# Patient Record
Sex: Male | Born: 1940 | Race: White | Hispanic: No | Marital: Married | Smoking: Never smoker
Health system: Southern US, Community
[De-identification: ages and names within clinical notes are randomized; demographics above are authoritative.]

## PROBLEM LIST (undated history)

## (undated) DIAGNOSIS — N400 Enlarged prostate without lower urinary tract symptoms: Secondary | ICD-10-CM

## (undated) DIAGNOSIS — E049 Nontoxic goiter, unspecified: Secondary | ICD-10-CM

## (undated) DIAGNOSIS — K219 Gastro-esophageal reflux disease without esophagitis: Secondary | ICD-10-CM

## (undated) DIAGNOSIS — E119 Type 2 diabetes mellitus without complications: Secondary | ICD-10-CM

## (undated) DIAGNOSIS — I6529 Occlusion and stenosis of unspecified carotid artery: Secondary | ICD-10-CM

## (undated) DIAGNOSIS — C801 Malignant (primary) neoplasm, unspecified: Secondary | ICD-10-CM

## (undated) DIAGNOSIS — I10 Essential (primary) hypertension: Secondary | ICD-10-CM

## (undated) DIAGNOSIS — E785 Hyperlipidemia, unspecified: Secondary | ICD-10-CM

## (undated) DIAGNOSIS — I482 Chronic atrial fibrillation, unspecified: Secondary | ICD-10-CM

## (undated) HISTORY — DX: Type 2 diabetes mellitus without complications: E11.9

## (undated) HISTORY — DX: Nontoxic goiter, unspecified: E04.9

## (undated) HISTORY — DX: Benign prostatic hyperplasia without lower urinary tract symptoms: N40.0

## (undated) HISTORY — DX: Gastro-esophageal reflux disease without esophagitis: K21.9

## (undated) HISTORY — PX: OTHER SURGICAL HISTORY: SHX169

## (undated) HISTORY — PX: SEPTOPLASTY: SUR1290

## (undated) HISTORY — DX: Chronic atrial fibrillation, unspecified: I48.20

## (undated) HISTORY — DX: Occlusion and stenosis of unspecified carotid artery: I65.29

## (undated) HISTORY — DX: Hyperlipidemia, unspecified: E78.5

## (undated) HISTORY — DX: Essential (primary) hypertension: I10

## (undated) HISTORY — PX: HERNIA REPAIR: SHX51

## (undated) HISTORY — PX: TONSILECTOMY, ADENOIDECTOMY, BILATERAL MYRINGOTOMY AND TUBES: SHX2538

## (undated) HISTORY — PX: CHOLECYSTECTOMY: SHX55

## (undated) HISTORY — PX: LAMINECTOMY: SHX219

---

## 2000-11-06 ENCOUNTER — Ambulatory Visit (HOSPITAL_BASED_OUTPATIENT_CLINIC_OR_DEPARTMENT_OTHER): Admission: RE | Admit: 2000-11-06 | Discharge: 2000-11-06 | Payer: Self-pay | Admitting: *Deleted

## 2002-08-02 ENCOUNTER — Encounter: Payer: Self-pay | Admitting: Emergency Medicine

## 2002-08-02 ENCOUNTER — Emergency Department (HOSPITAL_COMMUNITY): Admission: EM | Admit: 2002-08-02 | Discharge: 2002-08-02 | Payer: Self-pay | Admitting: Emergency Medicine

## 2004-08-06 ENCOUNTER — Encounter (INDEPENDENT_AMBULATORY_CARE_PROVIDER_SITE_OTHER): Payer: Self-pay | Admitting: *Deleted

## 2004-08-06 ENCOUNTER — Observation Stay (HOSPITAL_COMMUNITY): Admission: EM | Admit: 2004-08-06 | Discharge: 2004-08-07 | Payer: Self-pay | Admitting: Emergency Medicine

## 2005-09-21 IMAGING — RF DG CHOLANGIOGRAM OPERATIVE
1 series · 2 of 2 positions shown · non-contrast
Comparison: none

CLINICAL DATA: 63-year-old, cholecystitis, laparoscopic cholecystectomy. 
 INTRAOPERATIVE CHOLANGIOGRAM 
 Intraoperative cholangiogram was performed with two fluoroscopic spot images.  There is cannulation of the cystic duct.  The common bile duct is slightly distended but no common bile duct stones are seen.  The intrahepatic ducts are not filled out.  There is drainage of contrast into the duodenum.  
 IMPRESSION
 Slightly prominent common bile duct.  No definite common bile duct stones and normal drainage of contrast into the duodenum.   
 Intrahepatic ducts are not visualized.

[Series 1: run · 2 of 2 slices shown]
[im 1/2]
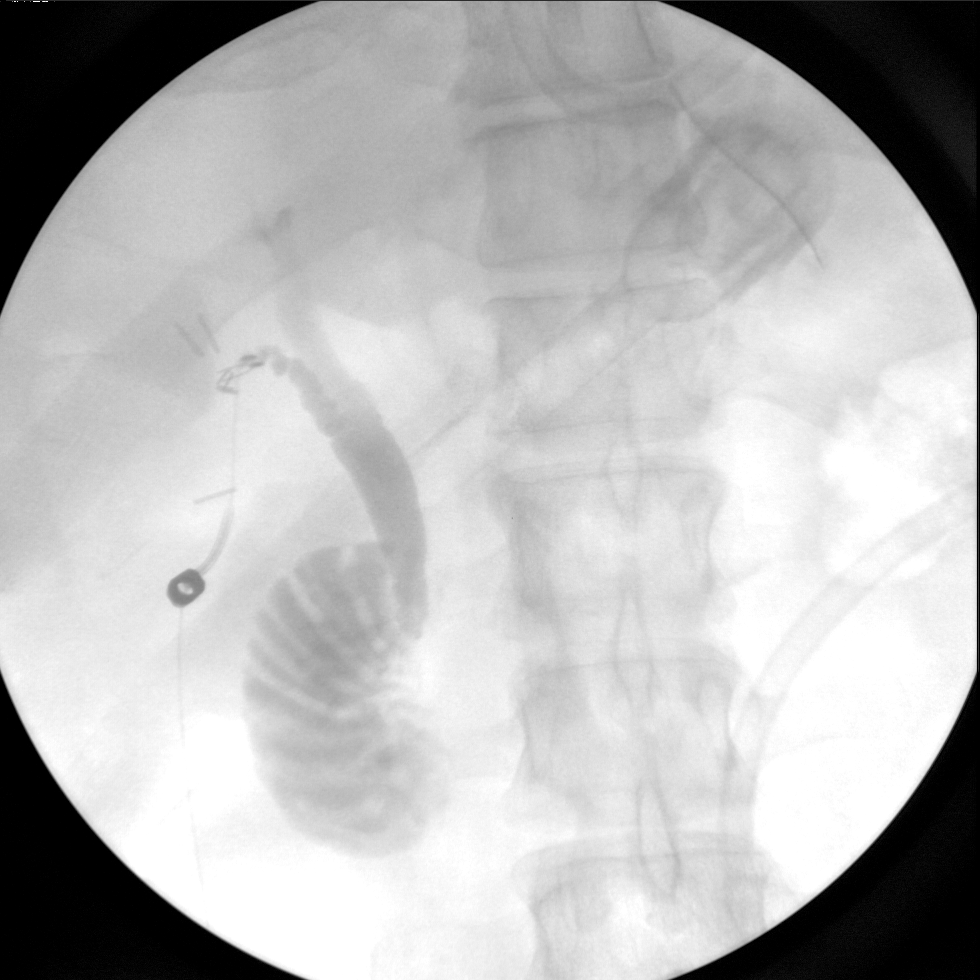
[im 2/2]
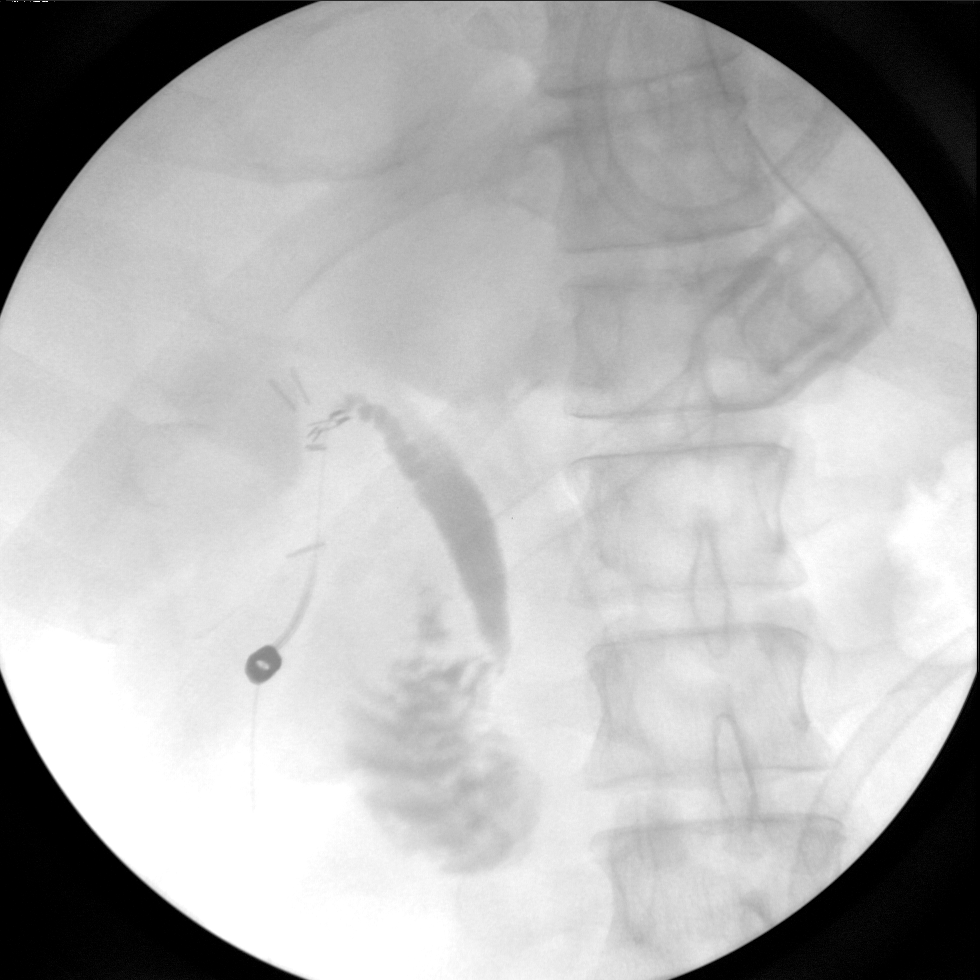

[2 of 2 positions shown; findings below may reference images not displayed]

## 2005-09-21 IMAGING — US US ABDOMEN COMPLETE
1 series · 14 of 25 positions shown · non-contrast
Comparison: none

CLINICAL DATA: Food poisoning, right upper abdominal pain

ABDOMEN ULTRASOUND

[Series 1: unknown · 0.33mm/px · 14 of 65 slices shown]
[im 1/65]
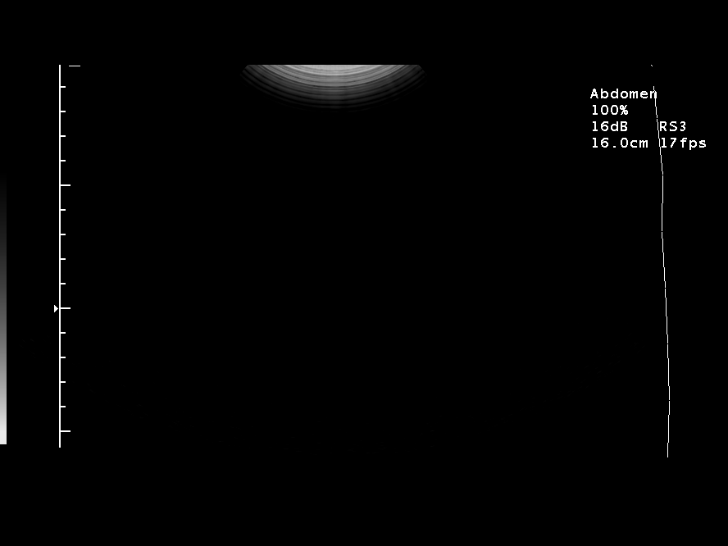
[im 6/65]
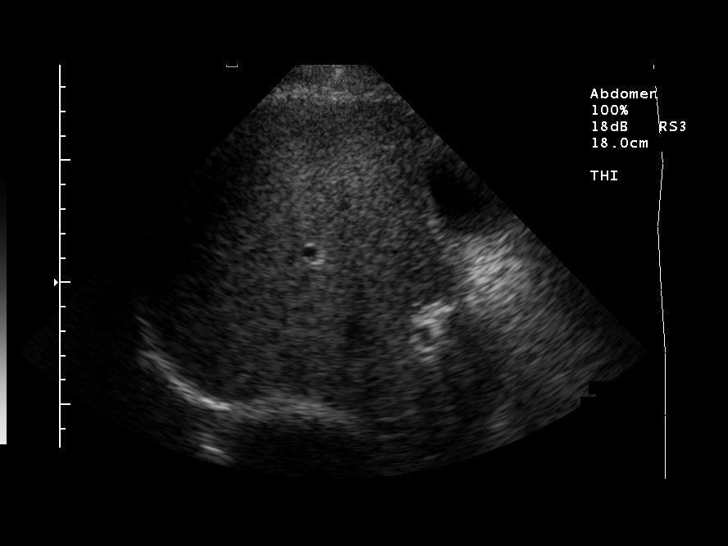
[im 11/65]
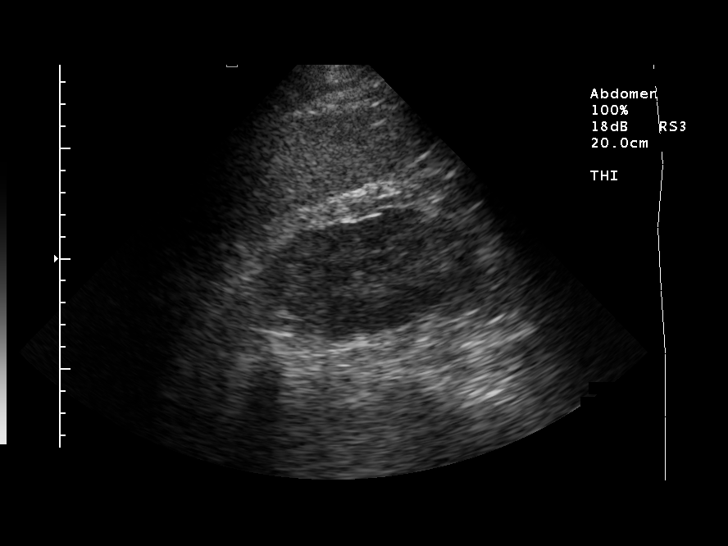
[im 17/65]
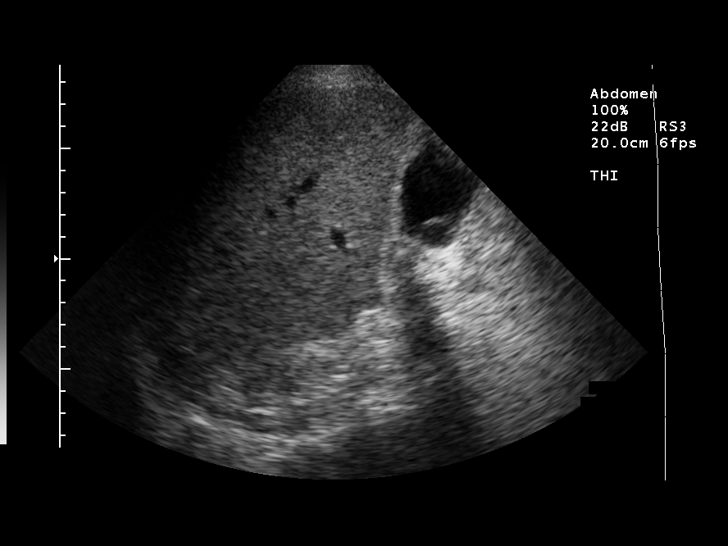
[im 22/65]
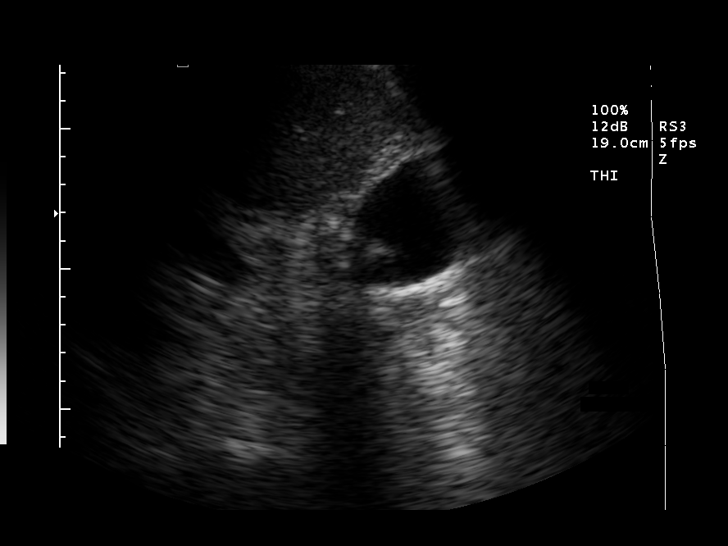
[im 25/65]
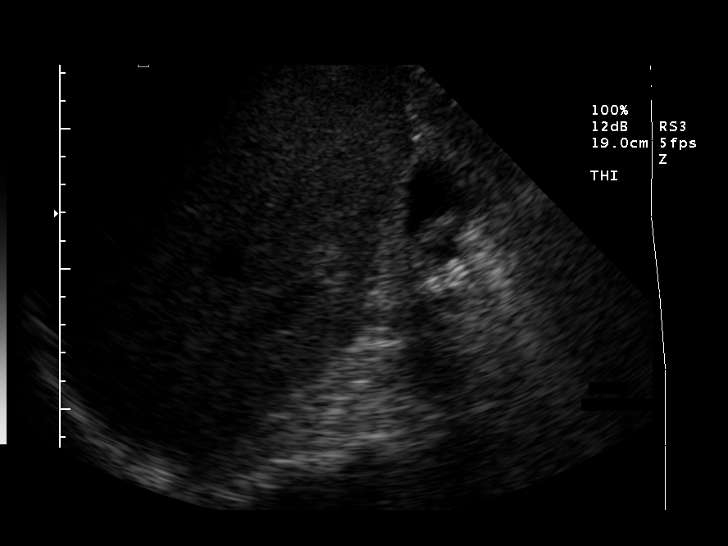
[im 30/65]
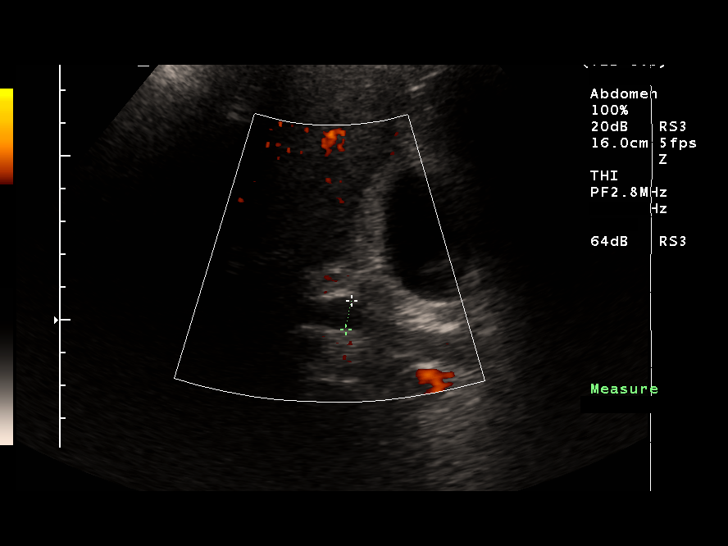
[im 35/65]
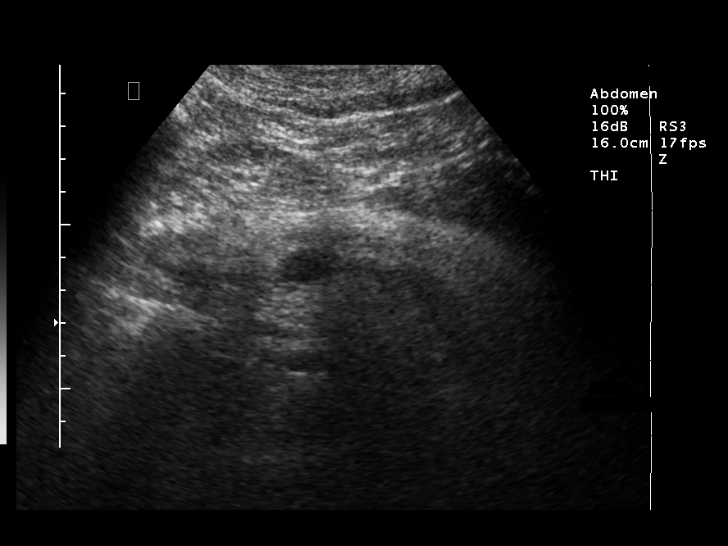
[im 41/65]
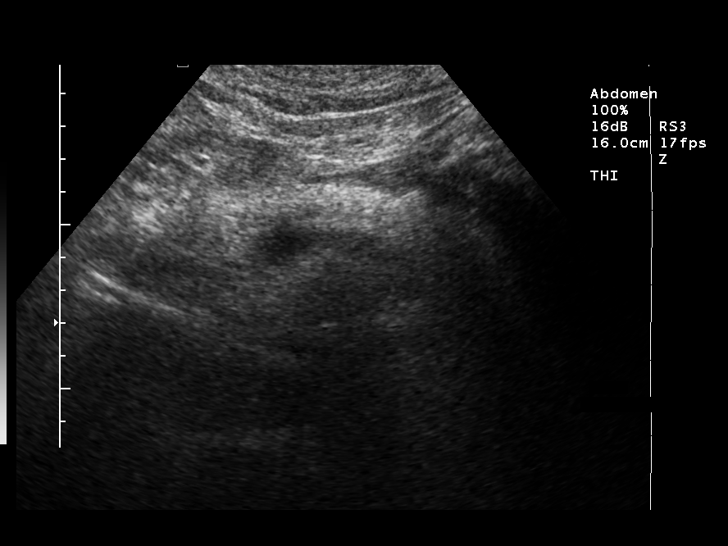
[im 43/65]
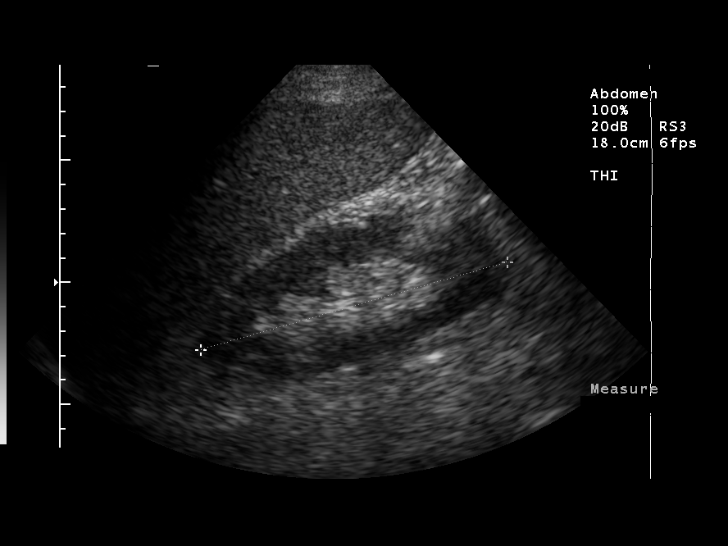
[im 49/65]
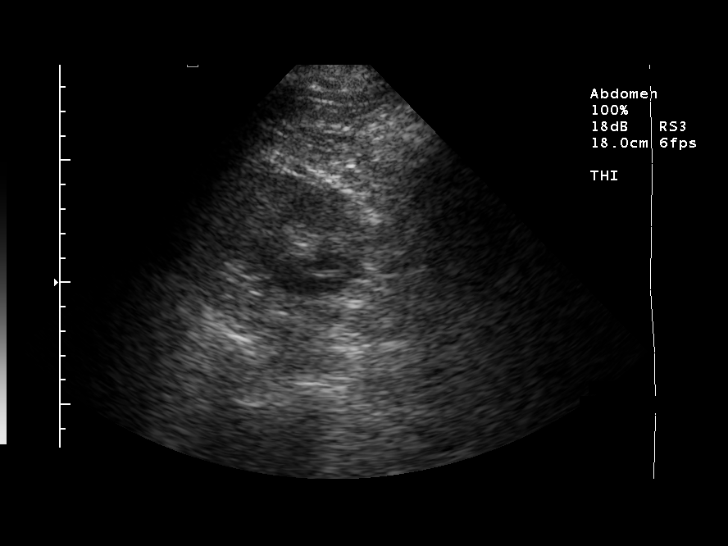
[im 54/65]
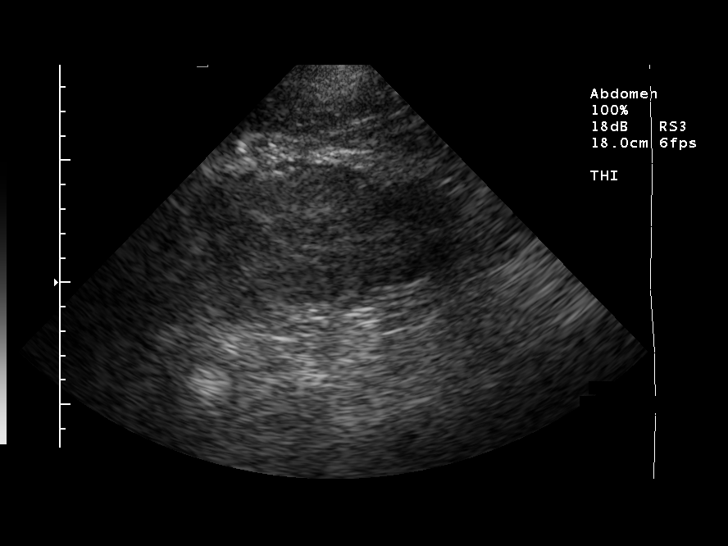
[im 59/65]
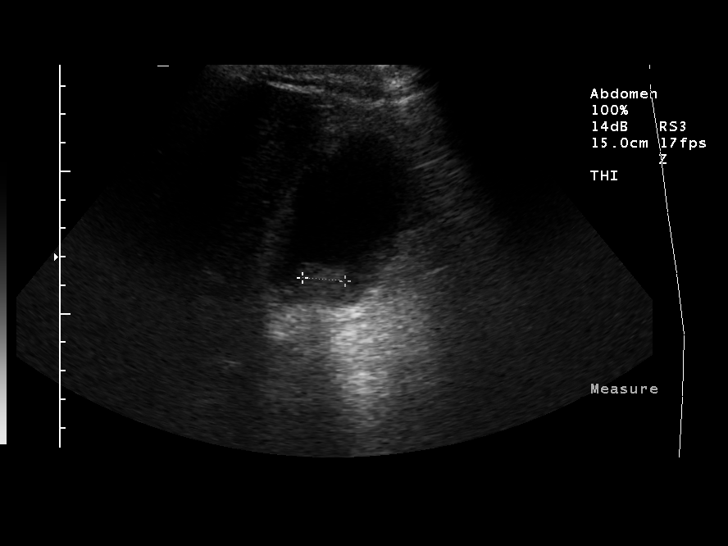
[im 65/65]
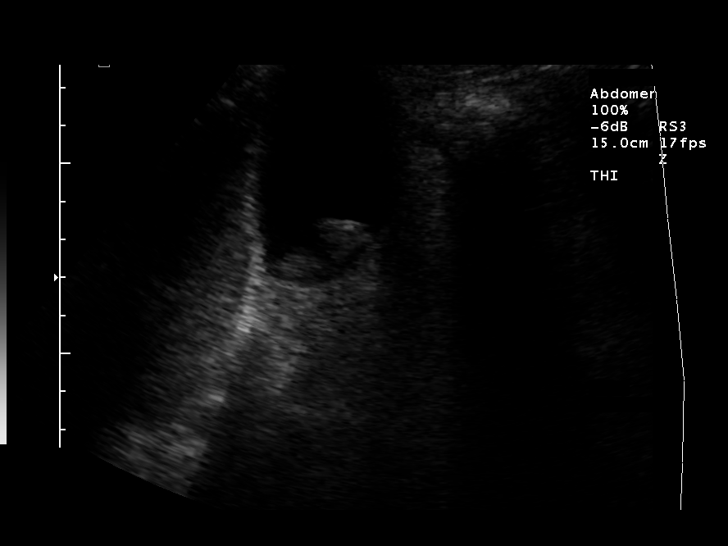

[14 of 25 positions shown; findings below may reference images not displayed]

FINDINGS: Liver unremarkable. Gallbladder physiologically distended with several subcentimeter
mobile calculi. There is gallbladder wall thickening measuring up to 6.3 mm in total thickness. No
pericholecystic fluid. The pancreatic duct is dilated. Common bile duct is dilated to 9 mm
diameter. Spleen and kidneys unremarkable. Visualized portions of the abdominal aorta, pancreas,
and IVC unremarkable, with portions of obscured by overlying bowel gas.
IMPRESSION: 1. Cholelithiasis, gallbladder wall thickening, and mild dilatation of the common bile duct
suggesting possible obstruction and cholecystitis.
2. Dilatation of the pancreatic duct. Correlate with any clinical or laboratory evidence of
obstruction.

## 2006-01-19 ENCOUNTER — Ambulatory Visit (HOSPITAL_COMMUNITY): Admission: RE | Admit: 2006-01-19 | Discharge: 2006-01-19 | Payer: Self-pay | Admitting: Cardiology

## 2007-07-22 ENCOUNTER — Ambulatory Visit (HOSPITAL_COMMUNITY): Admission: RE | Admit: 2007-07-22 | Discharge: 2007-07-22 | Payer: Self-pay | Admitting: Surgery

## 2008-09-02 IMAGING — CR DG CHEST 2V
2 series · 2 of 2 positions shown · non-contrast
Comparison: none

CLINICAL DATA: Bilateral inguinal hernias.  Atrial fibrillation.  Preop respiratory exam. 
 CHEST - 2 VIEW:

[w chest pa]
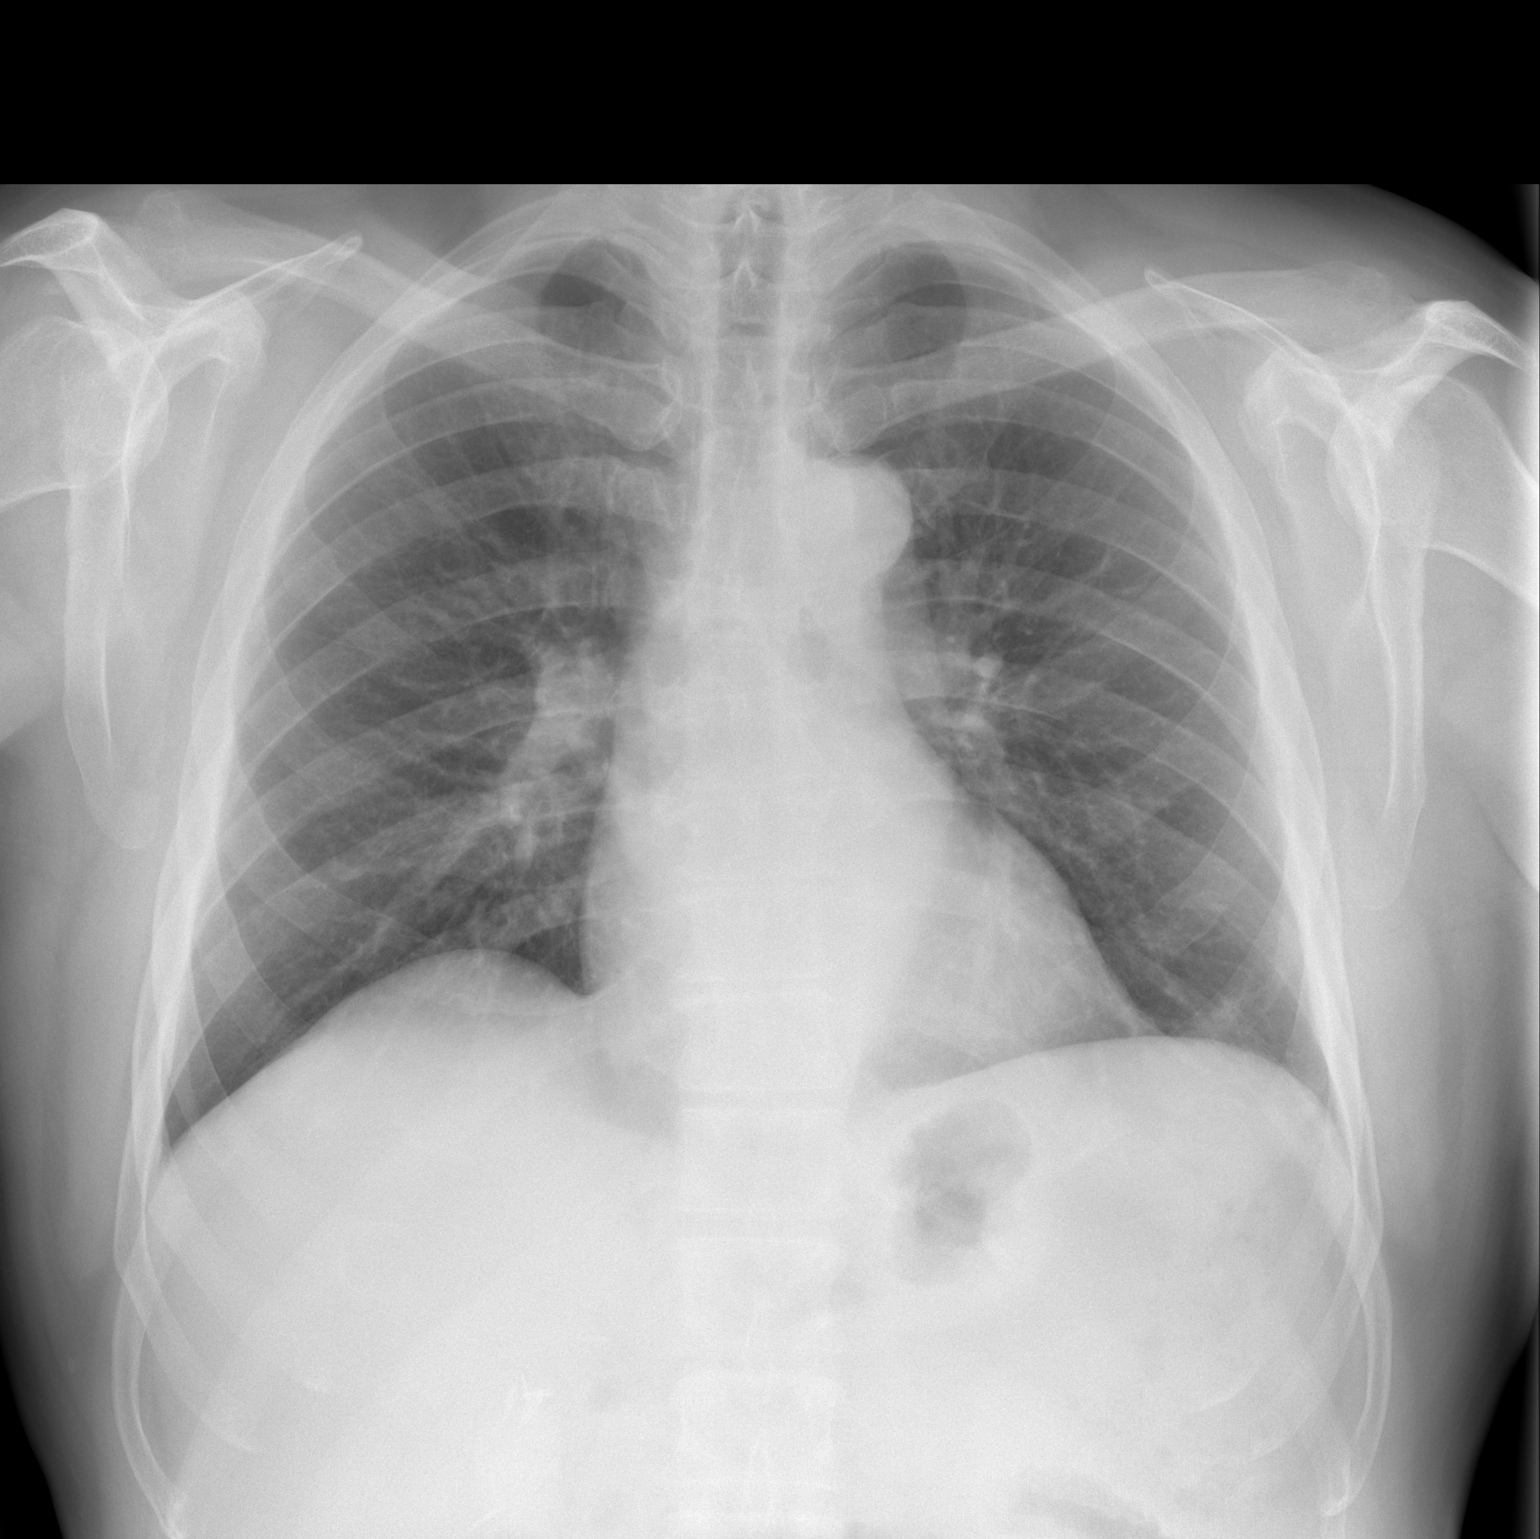

[w chest lat]
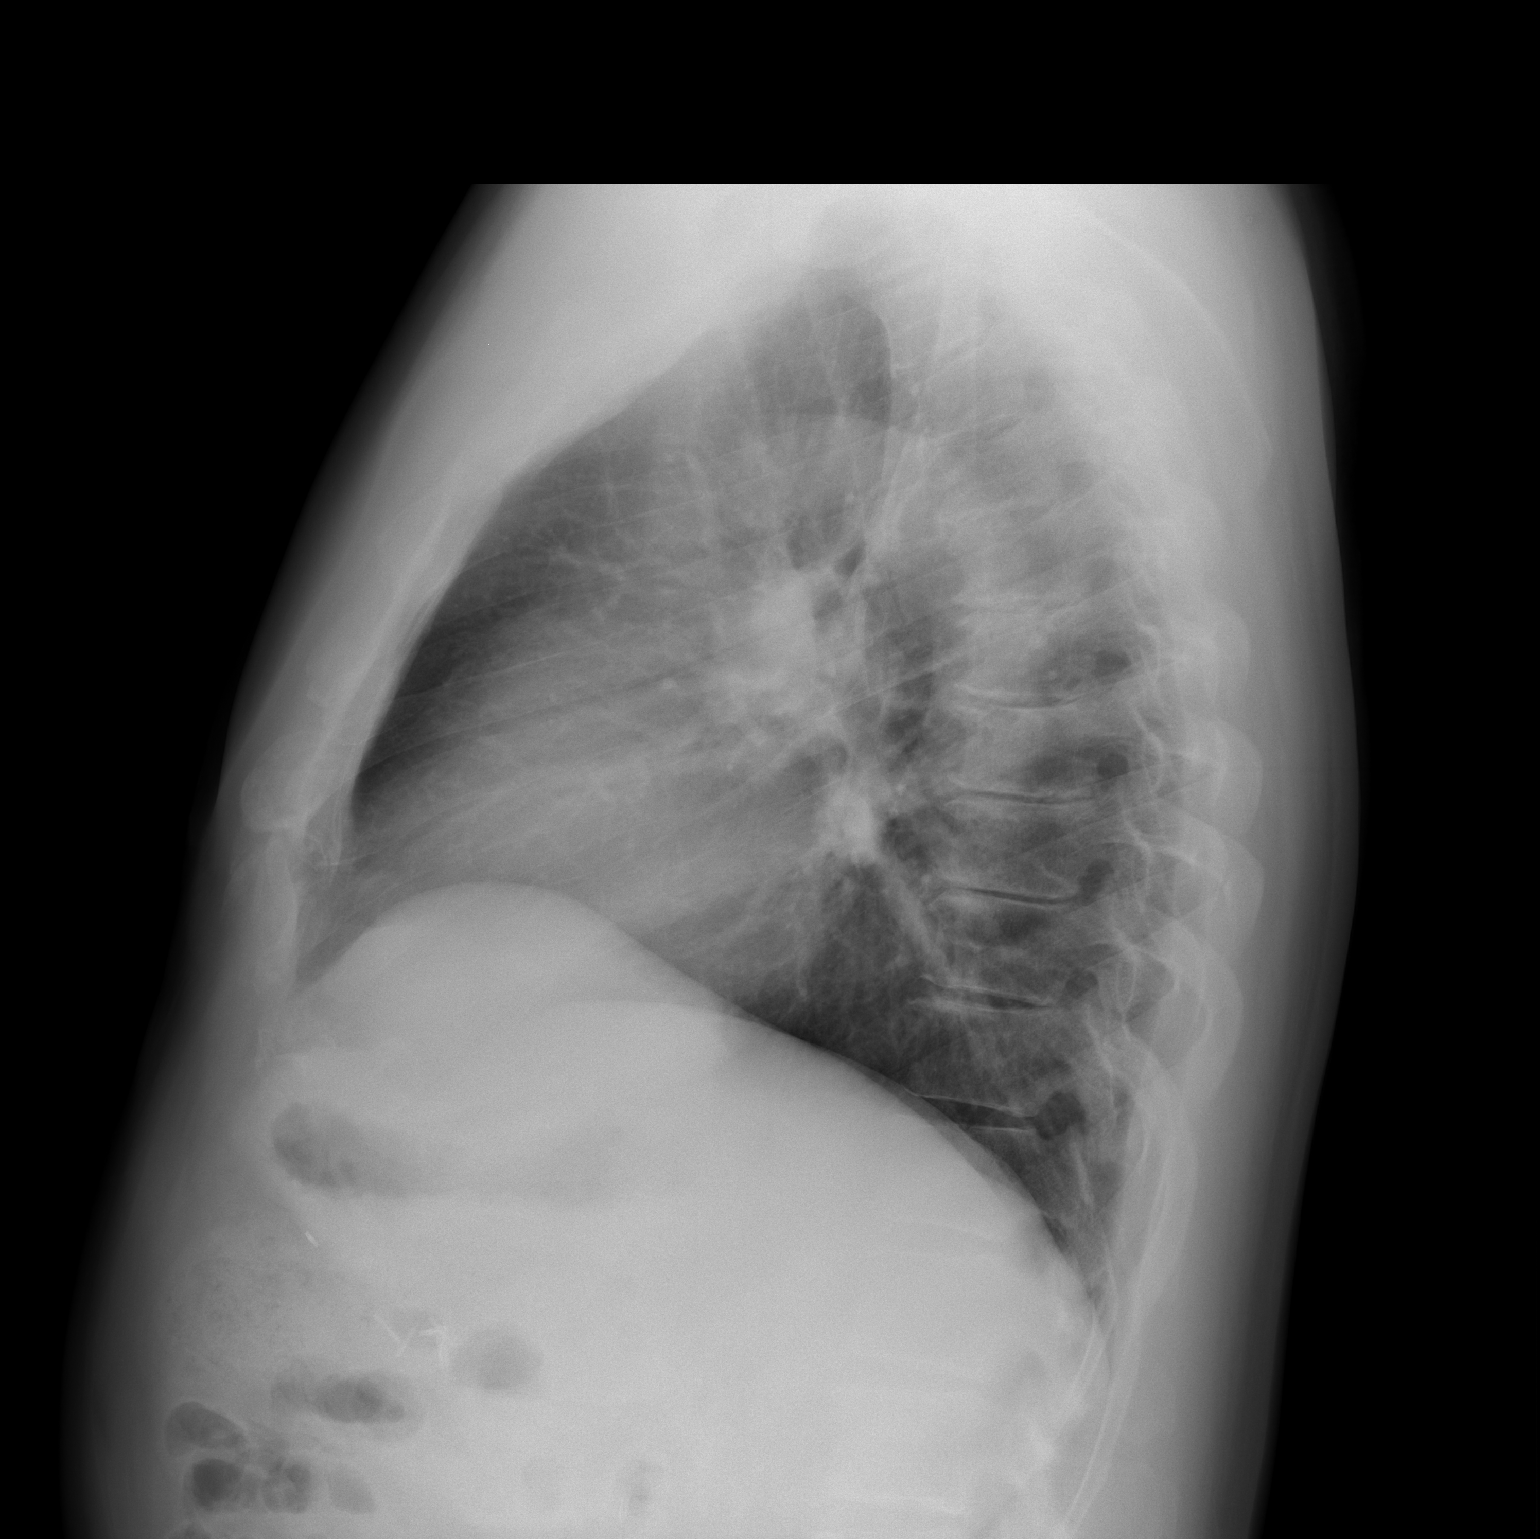

[2 of 2 positions shown; findings below may reference images not displayed]

FINDINGS: The heart size and mediastinal contours are within normal limits.  Both lungs are clear.  The visualized skeletal structures are unremarkable.
IMPRESSION: No active cardiopulmonary disease.

## 2010-02-28 ENCOUNTER — Encounter: Admission: RE | Admit: 2010-02-28 | Discharge: 2010-02-28 | Payer: Self-pay | Admitting: Family Medicine

## 2010-05-13 ENCOUNTER — Ambulatory Visit (HOSPITAL_COMMUNITY): Admission: RE | Admit: 2010-05-13 | Discharge: 2010-05-13 | Payer: Self-pay | Admitting: Cardiology

## 2011-01-04 LAB — MAGNESIUM: Magnesium: 2.2 mg/dL (ref 1.5–2.5)

## 2011-01-04 LAB — PROTIME-INR: Prothrombin Time: 28.4 seconds — ABNORMAL HIGH (ref 11.6–15.2)

## 2011-03-04 NOTE — Op Note (Signed)
[name], Francisco Olsen              ACCOUNT NO.:  [id number]   MEDICAL RECORD NO.:  [id number]          PATIENT TYPE:  AMB   LOCATION:  DAY                          FACILITY:  [facility]   PHYSICIAN:  Sandria Bales. Ezzard Standing, M.D.  DATE OF BIRTH:  [birth year]   DATE OF PROCEDURE:  07/22/2007  DATE OF DISCHARGE:                               OPERATIVE REPORT   PREOPERATIVE DIAGNOSIS:  Bilateral inguinal hernias, left greater than  right.   POSTOPERATIVE DIAGNOSIS:  Moderate-to-large indirect left inguinal  hernia, small direct right inguinal hernia with lipoma of cord.   PROCEDURE:  Laparoscopic bilateral inguinal hernia repair with Atrium  pre-cut mesh.   SURGEON:  Ovidio Kin, MD.   FIRST ASSISTANT:  None.   ANESTHESIA:  General endotracheal.   ESTIMATED BLOOD LOSS:  Minimal.   INDICATIONS FOR PROCEDURE:  The patient is a 70 year old white male who  has bilateral inguinal hernias, now comes for repair of these hernias.  He is a patient of Dr. Izora Ribas Cassiano's.  The indications and potential  complications of the procedure explained to the patient.  Potential  complications include, but are not limited to: Bleeding, infection,  nerve injury, recurrence of the hernia.   OPERATIVE NOTE:  The patient was placed in the supine position, given a  general endotracheal anesthetic. His lower abdomen was shaved, prepped  with Betadine solution and sterilely draped.   I used a [employer] all in 1-piece balloon.  I gave him 1 gram of Ancef  at initiation of procedure.  Time-out was held identifying the patient.   Infraumbilical incision was made extended to the anterior fascia, rectus  fascia on the left side and the muscle was retracted anteriorly and the  balloon passed in the preperitoneal space.  This was insufflated under  laparoscopic visualization with moderate insufflation.   I then removed the balloon, kept the 12-mm trocar in placed and placed  the 5-mm right lower quadrant and a 5-mm  left lower quadrant trocar.   The dissection was identified identifying the pubic tubercle, Cooper  ligament, cord structures on both sides.   On the left side he had a fairly large indirect inguinal hernia which  probably went out, a sac went down the cord structures, went down about  8 to 9 cm.  I teased this sac off the cord structures, placed an Endo  loop across the base of the sac.  He also had a small lipoma of the cord  on the left side.  He had no evidence of a direct inguinal hernia.   On the right side had an approximate 2-cm direct inguinal hernia on the  lateral aspect of the inguinal floor.  He had no obvious indirect hernia  on the right side.  He did have a moderate-to-large lipoma of the cord  on the right side.   Because both internal rings were fairly patulous, I used the Atrium pre-  cut mesh on both sides.  On both sides I inserted the mesh.  I wrapped  the mesh around and recreated an internal ring and I used staples to  staple it to the pubic bone at the midline, Cooper ligament inferiorly,  transverse fascia anteriorly and laterally.  I avoided the space lateral  to the iliac vessels and inferior to the ileopubic tract.   Both pieces of mesh lay flat, covered the hernia defects I thought well.  I took photos and placed these in the chart.   There was no evidence of bleeding at the end of the case.  Trocars were  removed in turn without bleeding at the trocar sites.  The fascia and  the anterior rectus fascia was closed.  The skin incision site was  closed with 4-0 Monocryl suture and painted with DuoDerm.   The patient tolerated the procedure well and was transferred to the  recovery room in good condition.      Sandria Bales. Ezzard Standing, M.D.  Electronically Signed     DHN/MEDQ  D:  07/22/2007  T:  07/22/2007  Job:  [id number]   cc:   Donia Guiles, M.D.  Fax: [phone]   Armanda Magic, M.D.  Fax: [phone]

## 2011-03-07 NOTE — H&P (Signed)
Francisco Olsen, Francisco Olsen NO.:  [id number]   MEDICAL RECORD NO.:  [id number]          PATIENT TYPE:  EMS   LOCATION:  MAJO                         FACILITY:  [facility]   PHYSICIAN:  Sandria Bales. Ezzard Standing, M.D.  DATE OF BIRTH:  01-Jun-1941   DATE OF ADMISSION:  08/06/2004  DATE OF DISCHARGE:                                HISTORY & PHYSICAL   HISTORY OF PRESENT ILLNESS:  This is a 70 year old white male who is a  patient of Dr. Donia Guiles, who had gone to [place] to eat dinner last  night; he had kind of a steak and cheese.  On the way home, he started  having abdominal pain which was diffuse and nonspecific, and started  vomiting.   He presented to the [facility] Emergency Room last night, has undergone an  evaluation.  He has been found to have gallstones with a thickened  gallbladder wall, the dilated common bile duct and elevated white blood  count consistent with cholecystitis.   PAST GASTROINTESTINAL HISTORY:  He has gastroesophageal reflux for which he  is on Nexium.  He has had no prior upper endoscopy.  He has no history of  peptic ulcer disease, liver disease, pancreatic disease.  He said he had a  colonoscopy about a year ago, but cannot remember the name of the doctor  who did it.  He has had no prior abdominal surgery.   PAST MEDICAL HISTORY:   ALLERGIES:  He has no allergies.   CURRENT MEDICATIONS:  His current medications include Lipitor, Nexium, he  takes a baby aspirin daily and then some vitamins.   REVIEW OF SYSTEMS:  NEUROLOGIC:  No history of seizure or loss of  consciousness.  PULMONARY:  No history of pneumonia or tuberculosis.  CARDIAC:  He has had no angina, chest pain or cardiac evaluation.  He has  never had a cardiac catheterization.  GASTROINTESTINAL:  See history of  present illness.  UROLOGIC:  No history of kidney stones or kidney  infections.   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   PHYSICAL EXAMINATION:  VITAL SIGNS:  On physical exam, his temperature is  98.5, blood pressure 148/79, pulse of 104, respirations 20.  GENERAL:  He is a well-nourished, mildly overweight white male, alert and  cooperative on physical exam.  HEENT:  Unremarkable.  NECK:  His neck is supple.  I feel no mass, no thyromegaly.  LUNGS:  His lungs are clear to auscultation with symmetric breath sounds.  HEART:  His heart is tachycardic.  He has easily palpable femoral pulses.  ABDOMEN:  He may be sore in his epigastrium, but really has no guarding or  tenderness and no masses I can feel.  EXTREMITIES:  He has good strength in the upper and lower extremities.  NEUROLOGIC:  He is grossly intact to motor and sensory function.   LABORATORIES:  Labs that I have show a white blood count of 16,400, a  hemoglobin of  13.9, hematocrit 41.3, platelet count of 205,000.  His sodium  is 140, potassium 3.6, chloride of 106, CO2 of 26, glucose is 150.  His  alkaline phosphatase is 65, his total bilirubin 0.7, his amylase was 125,  his lipase is 25.   His ultrasound shows gallstones with thickened gallbladder wall, possibly  dilated common bile duct to 9 mm.   His chest x-ray was negative.   His EKG was normal sinus rhythm.   IMPRESSION:  1.  Cholecystitis.  I discussed with the patient the indications and      potential complications of gallbladder surgery.  I feel he would be      better served going ahead now that he has a thickened gallbladder wall,      elevated white blood count and still feeling poorly.  He wants to go      home because he has got plans for retirement at the end of this month      and of course I made this his decision.  But  my medical advise is that      he go ahead with surgery.  I talked about the risks of surgery which      include, but are not limited to, bleeding, infection, the possibility of      open  surgery, which his wife had, and the possibility of common bile      duct or bowel injury.  2.  Hypercholesterolemia.  3.  Gastroesophageal reflux disease.  4.  He is on aspirin.      Davi   DHN/MEDQ  D:  08/06/2004  T:  08/06/2004  Job:  [id number]   cc:   Donia Guiles, M.D.  [address] [place]  [place] [address]  Fax: [phone]

## 2011-03-07 NOTE — Op Note (Signed)
NAMEDEVERY, MURGIA NO.:  [id number]   MEDICAL RECORD NO.:  [id number]          PATIENT TYPE:  OIB   LOCATION:  2899                         FACILITY:  [facility]   PHYSICIAN:  Armanda Magic, M.D.     DATE OF BIRTH:  December 09, 1940   DATE OF PROCEDURE:  01/19/2006  DATE OF DISCHARGE:                                 OPERATIVE REPORT   REFERRING PHYSICIAN:  Donia Guiles, M.D.   PROCEDURE:  Direct current cardioversion.   INDICATIONS FOR PROCEDURE:  Atrial fibrillation.   COMPLICATIONS:  None.   IV MEDICATIONS:  Pentothal 200 mg IV.   This is a 70 year old male who presents with new onset of atrial  fibrillation of unknown duration.  He has been on systemic anticoagulation  with an INR of greater than 2 for four weeks and now presents for electrical  cardioversion.   The patient was brought to the day hospital in a fasting nonsedated state.  Informed consent was obtained.  The patient was connected to continuous  heart rate and pulse oximetry monitoring and intermittent blood pressure  monitoring.  After adequate anesthesia was obtained with 200 mg of pentothal  IV, a synchronized 100 joule shock was delivered which was unsuccessful in  converting the patient to normal sinus rhythm.  A second synchronized shock  biphasic at 200 joules was delivered successfully converting the patient to  normal sinus rhythm.  The patient tolerated the procedure well without  complication.  Once the patient was fully awake, he was discharged to home.   ASSESSMENT:  1.  Atrial fibrillation with controlled ventricular response.  2.  Systemic anticoagulation with therapeutic INR greater than 2 for four      weeks.  3.  Successful cardioversion to normal sinus rhythm.   PLAN:  Discharged to home after fully awake, follow up with me in two weeks.  Continue current medications.      Armanda Magic, M.D.  Electronically Signed     TT/MEDQ  D:  01/19/2006  T:  01/20/2006  Job:   [id number]

## 2011-03-07 NOTE — Op Note (Signed)
NAMEDARUS, HERSHMAN NO.:  [id number]   MEDICAL RECORD NO.:  [id number]          PATIENT TYPE:  INP   LOCATION:  5729                         FACILITY:  [facility]   PHYSICIAN:  Sandria Bales. Ezzard Standing, M.D.  DATE OF BIRTH:  [birth year]   DATE OF PROCEDURE:  08/06/2004  DATE OF DISCHARGE:                                 OPERATIVE REPORT   PREOPERATIVE DIAGNOSIS:  Acute cholecystitis with cholelithiasis.   POSTOPERATIVE DIAGNOSIS:  Acute inflammatory cholecystitis with  cholelithiasis.   PROCEDURE:  Laparoscopic cholecystectomy with interoperative cholangiogram.   SURGEON:  Sandria Bales. Ezzard Standing, M.D.   ASSISTANTRiley Lam A. Magnus Ivan, M.D.   ANESTHESIA:  General endotracheal anesthesia.   ESTIMATED BLOOD LOSS:  Minimal.   INDICATIONS FOR PROCEDURE:  Mr. Widener is a 70 year old white male who  presented with increasing abdominal pain, nausea and vomiting, and  ultrasound consistent with cholelithiasis and cholecystitis.  I have  discussed with him about proceeding with laparoscopic cholecystectomy.  He  was hoping to go home but desired to go ahead and have the surgery.  I  discussed with him the potential complications which were included but not  limited to bleeding, infection, open surgery, bile duct injury.   OPERATIVE NOTE:  The patient presented to the operating room and was given  general endotracheal anesthesia.  His abdomen was shaved, prepped with  Betadine solution, and sterile draped.  He had PES stockings and was already  on Ancef antibiotic.  An infraumbilical incision was made with sharp  dissection carried down to the abdominal cavity.  A 0 degree 10 mm  laparoscope was inserted through a 12 mm [employer] trocar and the  [employer] trocar was secured with a 0 Vicryl suture.  I did abdominal  exploration revealing the right and left lobes of the liver were  unremarkable.  The anterior wall of the stomach was unremarkable.  However,  the patient  had an inflammatory mass around his gallbladder with the omentum  socked in acutely around an acutely inflamed gallbladder.  I placed three  additional trocars, a 10 mm subxiphoid trocar, a 5 mm right subcostal, and a  5 mm lateral subcostal.  The fat was dissected off the gallbladder wall and  identified an acutely inflamed, almost semi-necrotic gallbladder.  I  decompressed the gallbladder and then dissected off the omentum which  covered the entire gallbladder down to the cystic duct gallbladder junction  and defined the cystic duct.  The patient had a large lymph node of Calot.  I triply divided the cystic artery.  I placed a clip on the gallbladder side  of the cystic duct and shot an interoperative cholangiogram.  The  interoperative cholangiogram was shot using a cut off taut catheter inserted  through a 16 gauge Jelco into the abdominal cavity and into the cut side of  the cystic duct.  The interoperative cholangiogram was shot using half  strength Hypaque, I used about 6 mL.  This was done under fluoroscopy  showing free flow of contrast down the cystic duct into the common bile duct  into the  duodenum.  I had to place the patient in mild Trendelenburg to get  the hepatic radicals to fill but these filled without incident.  This was  felt to be a normal interoperative cholangiogram.   The taut catheter was then removed, the cystic duct triply endoclipped and  divided.  The gallbladder was then divided from the cystic duct and taken  away from the gallbladder bed.  Again, this was an inflammatory, bloody  gallbladder and I had to place a couple of more clips in the gallbladder and  use the Bovie electrocautery to control the bleeding from the gallbladder  bed.  After complete division of the gallbladder from the gallbladder bed, I  placed the gallbladder in the endocatch bag.  I revisualized the gallbladder  bed.  There was no bleeding.  The triangle of Calot was revisualized,  there  was no bleeding or bile leak from this area.  I did use a little over 2  liters of saline to irrigate out the abdomen and the gallbladder bed.  The  gallbladder was then delivered through the umbilicus.  The umbilical port  was closed with a 0 Vicryl suture.  Each trocar was removed, there was no  bleeding in any trocar site.  The skin was closed with a 5-0 Vicryl suture.  The wounds were painted with tincture of Benzoin and Steri-Strips applied.  The patient tolerated the procedure well and was transported to the recovery  room in good condition.  Sponge and needle counts were correct at the end of  the case.  Again, the patient had a fairly inflamed gallbladder and it is  already 7:30 at night.  When he will be ready to go home tomorrow I do not  know and we will have to play that by ear.      Davi   DHN/MEDQ  D:  08/06/2004  T:  08/06/2004  Job:  [id number]

## 2011-07-29 ENCOUNTER — Other Ambulatory Visit: Payer: Self-pay | Admitting: Family Medicine

## 2011-07-29 DIAGNOSIS — E041 Nontoxic single thyroid nodule: Secondary | ICD-10-CM

## 2011-07-31 ENCOUNTER — Ambulatory Visit
Admission: RE | Admit: 2011-07-31 | Discharge: 2011-07-31 | Disposition: A | Discharge status: Home / Self Care | Payer: Medicare Other | Source: Ambulatory Visit | Attending: Family Medicine | Admitting: Family Medicine

## 2011-07-31 DIAGNOSIS — E041 Nontoxic single thyroid nodule: Secondary | ICD-10-CM

## 2011-07-31 LAB — COMPREHENSIVE METABOLIC PANEL
ALT: 26
AST: 22
Albumin: 3.8
Alkaline Phosphatase: 57
BUN: 13
CO2: 31
Calcium: 10.1
Chloride: 105
Creatinine, Ser: 0.83
GFR calc Af Amer: >60
GFR calc non Af Amer: >60
Glucose, Bld: 131 — ABNORMAL HIGH
Potassium: 4.9
Sodium: 144
Total Bilirubin: 0.8
Total Protein: 6.5

## 2011-07-31 LAB — DIFFERENTIAL
Basophils Relative: 0
Eosinophils Relative: 5
Lymphocytes Relative: 33
Lymphs Abs: 3
Monocytes Absolute: 0.6
Monocytes Relative: 6
Neutro Abs: 5.1
Neutrophils Relative %: 56

## 2011-07-31 LAB — CBC
HCT: 41.4
Hemoglobin: 13.9
MCV: 83.8
RDW: 14

## 2011-10-21 DIAGNOSIS — E049 Nontoxic goiter, unspecified: Secondary | ICD-10-CM

## 2011-10-21 HISTORY — DX: Nontoxic goiter, unspecified: E04.9

## 2012-02-02 ENCOUNTER — Other Ambulatory Visit: Payer: Self-pay | Admitting: Gastroenterology

## 2012-08-19 ENCOUNTER — Other Ambulatory Visit: Payer: Self-pay | Admitting: Family Medicine

## 2012-08-19 DIAGNOSIS — E042 Nontoxic multinodular goiter: Secondary | ICD-10-CM

## 2012-08-25 ENCOUNTER — Ambulatory Visit
Admission: RE | Admit: 2012-08-25 | Discharge: 2012-08-25 | Disposition: A | Discharge status: Home / Self Care | Payer: Medicare Other | Source: Ambulatory Visit | Attending: Family Medicine | Admitting: Family Medicine

## 2012-08-25 DIAGNOSIS — E042 Nontoxic multinodular goiter: Secondary | ICD-10-CM

## 2013-07-26 ENCOUNTER — Other Ambulatory Visit: Payer: Self-pay | Admitting: Cardiology

## 2013-08-01 ENCOUNTER — Encounter: Payer: Self-pay | Admitting: Cardiology

## 2013-08-19 ENCOUNTER — Other Ambulatory Visit: Payer: Self-pay | Admitting: Cardiology

## 2013-08-22 ENCOUNTER — Ambulatory Visit (INDEPENDENT_AMBULATORY_CARE_PROVIDER_SITE_OTHER): Payer: Medicare Other | Admitting: Pharmacist

## 2013-08-22 DIAGNOSIS — Z7901 Long term (current) use of anticoagulants: Secondary | ICD-10-CM

## 2013-08-22 DIAGNOSIS — I4821 Permanent atrial fibrillation: Secondary | ICD-10-CM | POA: Insufficient documentation

## 2013-08-22 DIAGNOSIS — Z79899 Other long term (current) drug therapy: Secondary | ICD-10-CM

## 2013-08-22 DIAGNOSIS — I4891 Unspecified atrial fibrillation: Secondary | ICD-10-CM

## 2013-08-22 LAB — POCT INR: INR: 2.6

## 2013-10-01 ENCOUNTER — Other Ambulatory Visit: Payer: Self-pay | Admitting: Cardiology

## 2013-10-03 ENCOUNTER — Ambulatory Visit (INDEPENDENT_AMBULATORY_CARE_PROVIDER_SITE_OTHER): Payer: Medicare Other | Admitting: Pharmacist

## 2013-10-03 DIAGNOSIS — I4891 Unspecified atrial fibrillation: Secondary | ICD-10-CM

## 2013-10-03 DIAGNOSIS — Z79899 Other long term (current) drug therapy: Secondary | ICD-10-CM

## 2013-10-03 DIAGNOSIS — Z7901 Long term (current) use of anticoagulants: Secondary | ICD-10-CM

## 2013-11-14 ENCOUNTER — Ambulatory Visit (INDEPENDENT_AMBULATORY_CARE_PROVIDER_SITE_OTHER): Payer: Medicare Other | Admitting: Pharmacist

## 2013-11-14 DIAGNOSIS — Z79899 Other long term (current) drug therapy: Secondary | ICD-10-CM

## 2013-11-14 DIAGNOSIS — Z7901 Long term (current) use of anticoagulants: Secondary | ICD-10-CM

## 2013-11-14 DIAGNOSIS — Z5181 Encounter for therapeutic drug level monitoring: Secondary | ICD-10-CM

## 2013-11-14 DIAGNOSIS — I4891 Unspecified atrial fibrillation: Secondary | ICD-10-CM

## 2013-11-14 LAB — POCT INR: INR: 2.3

## 2013-12-26 ENCOUNTER — Ambulatory Visit (INDEPENDENT_AMBULATORY_CARE_PROVIDER_SITE_OTHER): Payer: Medicare Other | Admitting: Pharmacist

## 2013-12-26 DIAGNOSIS — Z5181 Encounter for therapeutic drug level monitoring: Secondary | ICD-10-CM

## 2013-12-26 DIAGNOSIS — I4891 Unspecified atrial fibrillation: Secondary | ICD-10-CM

## 2013-12-26 DIAGNOSIS — Z79899 Other long term (current) drug therapy: Secondary | ICD-10-CM

## 2013-12-26 LAB — POCT INR: INR: 2.7

## 2014-01-30 ENCOUNTER — Other Ambulatory Visit: Payer: Self-pay | Admitting: *Deleted

## 2014-01-30 MED ORDER — WARFARIN SODIUM 5 MG PO TABS: ORAL_TABLET | ORAL | Status: DC

## 2014-02-06 ENCOUNTER — Ambulatory Visit (INDEPENDENT_AMBULATORY_CARE_PROVIDER_SITE_OTHER): Payer: Medicare Other | Admitting: Pharmacist

## 2014-02-06 DIAGNOSIS — I4891 Unspecified atrial fibrillation: Secondary | ICD-10-CM

## 2014-02-06 DIAGNOSIS — Z5181 Encounter for therapeutic drug level monitoring: Secondary | ICD-10-CM

## 2014-02-06 DIAGNOSIS — Z79899 Other long term (current) drug therapy: Secondary | ICD-10-CM

## 2014-02-06 LAB — POCT INR: INR: 2.6

## 2014-02-13 ENCOUNTER — Encounter: Payer: Self-pay | Admitting: Cardiology

## 2014-02-14 ENCOUNTER — Other Ambulatory Visit: Payer: Self-pay | Admitting: Family Medicine

## 2014-02-14 ENCOUNTER — Other Ambulatory Visit: Payer: Self-pay | Admitting: General Surgery

## 2014-02-14 DIAGNOSIS — E041 Nontoxic single thyroid nodule: Secondary | ICD-10-CM

## 2014-02-14 MED ORDER — AMLODIPINE BESYLATE 2.5 MG PO TABS: 5.0000 mg | ORAL_TABLET | Freq: Every day | ORAL | Status: DC

## 2014-02-17 ENCOUNTER — Ambulatory Visit
Admission: RE | Admit: 2014-02-17 | Discharge: 2014-02-17 | Disposition: A | Discharge status: Home / Self Care | Payer: Medicare Other | Source: Ambulatory Visit | Attending: Family Medicine | Admitting: Family Medicine

## 2014-02-17 DIAGNOSIS — E041 Nontoxic single thyroid nodule: Secondary | ICD-10-CM

## 2014-03-01 ENCOUNTER — Encounter: Payer: Medicare Other | Attending: Family Medicine

## 2014-03-01 VITALS — Ht 69.0 in | Wt 181.2 lb

## 2014-03-01 DIAGNOSIS — E119 Type 2 diabetes mellitus without complications: Secondary | ICD-10-CM

## 2014-03-01 DIAGNOSIS — Z713 Dietary counseling and surveillance: Secondary | ICD-10-CM | POA: Insufficient documentation

## 2014-03-05 NOTE — Progress Notes (Signed)
Patient was seen on 03/01/14 for the first of a series of three diabetes self-management courses at the [facility].  Current HbA1c: 6.5%  The following learning objectives were met by the patient during this class:  Describe diabetes  State some common risk factors for diabetes  Defines the role of glucose and insulin  Identifies type of diabetes and pathophysiology  Describe the relationship between diabetes and cardiovascular risk  State the members of the Healthcare Team  States the rationale for glucose monitoring  State when to test glucose  State their individual Target Range  State the importance of logging glucose readings  Describe how to interpret glucose readings  Identifies A1C target  Explain the correlation between A1c and eAG values  State symptoms and treatment of high blood glucose  State symptoms and treatment of low blood glucose  Explain proper technique for glucose testing  Identifies proper sharps disposal  Handouts given during class include:  Living Well with Diabetes book  Carb Counting and Meal Planning book  Meal Plan Card  Carbohydrate guide  Meal planning worksheet  Low Sodium Flavoring Tips  The diabetes portion plate  I9J to eAG Conversion Chart  Diabetes Medications  Diabetes Recommended Care Schedule  Support Group  Diabetes Success Plan  Core Class Satisfaction Survey  Follow-Up Plan:  Attend core 2

## 2014-03-08 DIAGNOSIS — E119 Type 2 diabetes mellitus without complications: Secondary | ICD-10-CM

## 2014-03-08 NOTE — Progress Notes (Signed)

## 2014-03-15 DIAGNOSIS — E119 Type 2 diabetes mellitus without complications: Secondary | ICD-10-CM

## 2014-03-15 NOTE — Progress Notes (Signed)
Patient was seen on 03/15/14 for the third of a series of three diabetes self-management courses at the [facility]. The following learning objectives were met by the patient during this class:    State the amount of activity recommended for healthy living   Describe activities suitable for individual needs   Identify ways to regularly incorporate activity into daily life   Identify barriers to activity and ways to over come these barriers  Identify diabetes medications being personally used and their primary action for lowering glucose and possible side effects   Describe role of stress on blood glucose and develop strategies to address psychosocial issues   Identify diabetes complications and ways to prevent them  Explain how to manage diabetes during illness   Evaluate success in meeting personal goal   Establish 2-3 goals that they will plan to diligently work on until they return for the  59-month follow-up visit  Goals:  Follow Diabetes Meal Plan as instructed  Aim for 15-30 mins of physical activity daily as tolerated  Bring food record and glucose log to your follow up visit  Your patient has established the following 4 month goals in their individualized success plan: I will increase my activity level   Your patient has identified these potential barriers to change:  None stated  Your patient has identified their diabetes self-care support plan as  [facility] Support Group available   Plan:  Attend Core 4 in 4 months

## 2014-03-20 ENCOUNTER — Ambulatory Visit (INDEPENDENT_AMBULATORY_CARE_PROVIDER_SITE_OTHER): Payer: Medicare Other | Admitting: Pharmacist

## 2014-03-20 DIAGNOSIS — Z79899 Other long term (current) drug therapy: Secondary | ICD-10-CM

## 2014-03-20 DIAGNOSIS — I4891 Unspecified atrial fibrillation: Secondary | ICD-10-CM

## 2014-03-20 DIAGNOSIS — Z5181 Encounter for therapeutic drug level monitoring: Secondary | ICD-10-CM

## 2014-03-20 LAB — POCT INR: INR: 2.1

## 2014-04-13 ENCOUNTER — Other Ambulatory Visit: Payer: Self-pay | Admitting: *Deleted

## 2014-04-13 MED ORDER — WARFARIN SODIUM 5 MG PO TABS: ORAL_TABLET | ORAL | Status: DC

## 2014-05-01 ENCOUNTER — Ambulatory Visit (INDEPENDENT_AMBULATORY_CARE_PROVIDER_SITE_OTHER): Payer: Medicare Other | Admitting: Pharmacist

## 2014-05-01 DIAGNOSIS — Z5181 Encounter for therapeutic drug level monitoring: Secondary | ICD-10-CM

## 2014-05-01 DIAGNOSIS — Z79899 Other long term (current) drug therapy: Secondary | ICD-10-CM

## 2014-05-01 DIAGNOSIS — I4891 Unspecified atrial fibrillation: Secondary | ICD-10-CM

## 2014-05-01 LAB — POCT INR: INR: 1.9

## 2014-05-22 ENCOUNTER — Ambulatory Visit (INDEPENDENT_AMBULATORY_CARE_PROVIDER_SITE_OTHER): Payer: Medicare Other | Admitting: Pharmacist

## 2014-05-22 ENCOUNTER — Encounter: Payer: Medicare Other | Attending: Family Medicine

## 2014-05-22 DIAGNOSIS — Z79899 Other long term (current) drug therapy: Secondary | ICD-10-CM

## 2014-05-22 DIAGNOSIS — I4891 Unspecified atrial fibrillation: Secondary | ICD-10-CM

## 2014-05-22 DIAGNOSIS — Z713 Dietary counseling and surveillance: Secondary | ICD-10-CM | POA: Diagnosis not present

## 2014-05-22 DIAGNOSIS — Z5181 Encounter for therapeutic drug level monitoring: Secondary | ICD-10-CM

## 2014-05-22 DIAGNOSIS — E119 Type 2 diabetes mellitus without complications: Secondary | ICD-10-CM | POA: Diagnosis not present

## 2014-05-22 LAB — POCT INR: INR: 2.2

## 2014-05-22 NOTE — Progress Notes (Signed)
Patient was seen on 05/22/14 for a review of the series of three diabetes self-management courses at the [facility]. The following learning objectives were met by the patient during this class:    Reviewed blood glucose monitoring and interpretation including the recommended target ranges and Hgb A1c.    Reviewed on carb counting, importance of regularly scheduled meals/snacks, and meal planning.    Reviewed the effects of physical activity on glucose levels and long-term glucose control.  Recommended goal of 150 minutes of physical activity/week.   Reviewed patient medications and discussed role of medication on blood glucose and possible side effects.   Discussed strategies to manage stress, psychosocial issues, and other obstacles to diabetes management.   Encouraged moderate weight reduction to improve glucose levels.     Reviewed short-term complications: hyper- and hypo-glycemia.  Discussed causes, symptoms, and treatment options.   Reviewed prevention, detection, and treatment of long-term complications.  Discussed the role of prolonged elevated glucose levels on body systems.  Goals:  Follow Diabetes Meal Plan as instructed  Eat 3 meals and 2 snacks, every 3-5 hrs  Limit carbohydrate intake to 45 grams carbohydrate/meal Limit carbohydrate intake to 15 grams carbohydrate/snack Add lean protein foods to meals/snacks  Monitor glucose levels as instructed by your doctor  Aim for goal of 15-30 mins of physical activity daily as tolerated  Bring food record and glucose log to your next nutrition visit   

## 2014-05-22 NOTE — Patient Instructions (Signed)
Goals:  Follow Diabetes Meal Plan as instructed  Eat 3 meals and 2 snacks, every 3-5 hrs  Limit carbohydrate intake to 45 grams carbohydrate/meal Limit carbohydrate intake to 15 grams carbohydrate/snack Add lean protein foods to meals/snacks  Monitor glucose levels as instructed by your doctor  Aim for goal of 15-30 mins of physical activity daily as tolerated  Bring food record and glucose log to your next nutrition visit 

## 2014-06-28 ENCOUNTER — Other Ambulatory Visit: Payer: Self-pay | Admitting: Cardiology

## 2014-06-28 ENCOUNTER — Encounter: Payer: Self-pay | Admitting: General Surgery

## 2014-06-28 DIAGNOSIS — I10 Essential (primary) hypertension: Secondary | ICD-10-CM

## 2014-07-03 ENCOUNTER — Ambulatory Visit (INDEPENDENT_AMBULATORY_CARE_PROVIDER_SITE_OTHER): Payer: Medicare Other

## 2014-07-03 DIAGNOSIS — I4891 Unspecified atrial fibrillation: Secondary | ICD-10-CM

## 2014-07-03 DIAGNOSIS — Z79899 Other long term (current) drug therapy: Secondary | ICD-10-CM

## 2014-07-03 DIAGNOSIS — Z5181 Encounter for therapeutic drug level monitoring: Secondary | ICD-10-CM

## 2014-07-03 LAB — POCT INR: INR: 2.3

## 2014-07-19 ENCOUNTER — Encounter: Payer: Self-pay | Admitting: Cardiology

## 2014-07-19 ENCOUNTER — Ambulatory Visit (INDEPENDENT_AMBULATORY_CARE_PROVIDER_SITE_OTHER): Payer: Medicare Other | Admitting: Cardiology

## 2014-07-19 VITALS — BP 124/72 | HR 75 | Ht 72.0 in | Wt 182.6 lb

## 2014-07-19 DIAGNOSIS — I10 Essential (primary) hypertension: Secondary | ICD-10-CM

## 2014-07-19 DIAGNOSIS — E785 Hyperlipidemia, unspecified: Secondary | ICD-10-CM

## 2014-07-19 DIAGNOSIS — I482 Chronic atrial fibrillation, unspecified: Secondary | ICD-10-CM

## 2014-07-19 DIAGNOSIS — Z7901 Long term (current) use of anticoagulants: Secondary | ICD-10-CM

## 2014-07-19 DIAGNOSIS — I4891 Unspecified atrial fibrillation: Secondary | ICD-10-CM

## 2014-07-19 DIAGNOSIS — I779 Disorder of arteries and arterioles, unspecified: Secondary | ICD-10-CM | POA: Insufficient documentation

## 2014-07-19 DIAGNOSIS — I739 Peripheral vascular disease, unspecified: Secondary | ICD-10-CM

## 2014-07-19 NOTE — Patient Instructions (Signed)
Your physician recommends that you continue on your current medications as directed. Please refer to the Current Medication list given to you today.  Your physician has requested that you have a carotid duplex. This test is an ultrasound of the carotid arteries in your neck. It looks at blood flow through these arteries that supply the brain with blood. Allow one hour for this exam. There are no restrictions or special instructions.  Your physician recommends that you return for a FASTING Lipid profile and ALT (Please schedule at check out prior to leaving today)  Your physician wants you to follow-up in: 1 year with Dr Sherlyn Lickurner You will receive a reminder letter in the mail two months in advance. If you don't receive a letter, please call our office to schedule the follow-up appointment.

## 2014-07-19 NOTE — Progress Notes (Signed)
  [address] [place], [place]  [address] Phone: [phone] Fax:  [phone]  Date:  07/19/2014   ID:  Francisco PintoDonald Broder, DOB 1940/12/17, MRN [id number]  PCP:  Lupita RaiderSHAW,KIMBERLEE, MD  Cardiologist:  [name], MD    History of Present Illness: This is a 73yo WM with a history of Chronic atrial fibrillation, HTN, dyslipidemia, GERD and carotid artery stenosis who presents today for followup.  He is doing well.   He denies any chest pain, SOB, DOE, LE edema, dizziness, palpitations or syncope.   Wt Readings from Last 3 Encounters:  07/19/14 182 lb 9.6 oz (82.827 kg)  03/05/14 181 lb 3.2 oz (82.192 kg)     Past Medical History  Diagnosis Date  . Diabetes mellitus without complication   . Hyperlipidemia   . Hypertension   . GERD (gastroesophageal reflux disease)   . BPH (benign prostatic hyperplasia)   . Carotid artery stenosis   . 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 Chronic atrial fibrillation     on chronic systemic anticoagulation with warfarin    Current Outpatient Prescriptions  Medication Sig Dispense Refill  . amLODipine (NORVASC) 2.5 MG tablet Take 2 tablets (5 mg total) by mouth daily.  60 tablet  5  . metoprolol (LOPRESSOR) 50 MG tablet TAKE 1 TABLET BY MOUTH TWICE A DAY  60 tablet  11  . Omega-3 Fatty Acids (FISH OIL PO) Take 1,000 mg by mouth daily.       . rosuvastatin (CRESTOR) 20 MG tablet Take 20 mg by mouth daily.      Marland Kitchen. warfarin (COUMADIN) 5 MG tablet 1 TABLET EVERY DAY EXCEPT 1/2 TABLET ON WEDNESDAY OR AS DIRECTED BY COUMADIN CLINIC  35 tablet  3   No current facility-administered medications for this visit.    Allergies:    Allergies  Allergen Reactions  . Lipitor [Atorvastatin]     aches  . Plavix [Clopidogrel Bisulfate] Rash    Social History:  The patient  reports that he has never smoked. He does not have any smokeless tobacco history on file. He reports that he does not drink alcohol or use illicit drugs.   Family History:  The  patient's family history includes CAD in his brother, father, and sister; Diabetes Mellitus II in his brother and sister.   ROS:  Please see the history of present illness.      All other systems reviewed and negative.   PHYSICAL EXAM: VS:  BP 124/72  Pulse 75  Ht 6' (1.829 m)  Wt 182 lb 9.6 oz (82.827 kg)  BMI 24.76 kg/m2 Well nourished, well developed, in no acute distress HEENT: normal Neck: no JVD Cardiac:  normal S1, S2; RRR; no murmur Lungs:  clear to auscultation bilaterally, no wheezing, rhonchi or rales Abd: soft, nontender, no hepatomegaly Ext: no edema Skin: warm and dry Neuro:  CNs 2-12 intact, no focal abnormalities noted  EKG:  Atrial fibrillation rate controlled     ASSESSMENT AND PLAN:  1.  Chronic atrial fibrillation rate controlled on chronic systemic anticoagulation with warfarin 2.  HTN well controlled - continue amlodipine 3.  Carotid artery stenosis - recheck carotid artery dopplers 4.  Dyslipidemia - LDL goal <70 due to vascular disease and HTN - continue crestor - check FLP and ALT  Followup with me in 1 year   Signed, [name] [name], MD [facility] 07/19/2014 3:54 PM

## 2014-07-21 ENCOUNTER — Ambulatory Visit: Payer: Medicare Other | Admitting: Cardiology

## 2014-07-24 ENCOUNTER — Other Ambulatory Visit: Payer: Self-pay | Admitting: *Deleted

## 2014-07-24 MED ORDER — METOPROLOL TARTRATE 50 MG PO TABS: ORAL_TABLET | ORAL | Status: DC

## 2014-07-25 ENCOUNTER — Other Ambulatory Visit (INDEPENDENT_AMBULATORY_CARE_PROVIDER_SITE_OTHER): Payer: Medicare Other | Admitting: *Deleted

## 2014-07-25 ENCOUNTER — Ambulatory Visit (HOSPITAL_COMMUNITY): Payer: Medicare Other | Attending: Cardiology | Admitting: Cardiology

## 2014-07-25 DIAGNOSIS — E785 Hyperlipidemia, unspecified: Secondary | ICD-10-CM

## 2014-07-25 DIAGNOSIS — I2510 Atherosclerotic heart disease of native coronary artery without angina pectoris: Secondary | ICD-10-CM | POA: Diagnosis present

## 2014-07-25 DIAGNOSIS — E119 Type 2 diabetes mellitus without complications: Secondary | ICD-10-CM | POA: Insufficient documentation

## 2014-07-25 DIAGNOSIS — I779 Disorder of arteries and arterioles, unspecified: Secondary | ICD-10-CM

## 2014-07-25 DIAGNOSIS — I10 Essential (primary) hypertension: Secondary | ICD-10-CM | POA: Diagnosis not present

## 2014-07-25 DIAGNOSIS — I6523 Occlusion and stenosis of bilateral carotid arteries: Secondary | ICD-10-CM

## 2014-07-25 DIAGNOSIS — I739 Peripheral vascular disease, unspecified: Secondary | ICD-10-CM

## 2014-07-25 LAB — LIPID PANEL
CHOLESTEROL: 138 mg/dL (ref 0–200)
HDL: 34.5 mg/dL — ABNORMAL LOW (ref >39.00)
LDL Cholesterol: 84 mg/dL (ref 0–99)
NonHDL: 103.5
Total CHOL/HDL Ratio: 4
Triglycerides: 97 mg/dL (ref 0.0–149.0)
VLDL: 19.4 mg/dL (ref 0.0–40.0)

## 2014-07-25 NOTE — Progress Notes (Signed)
Carotid duplex performed 

## 2014-07-27 ENCOUNTER — Encounter (HOSPITAL_COMMUNITY): Payer: Self-pay | Admitting: Cardiology

## 2014-08-04 ENCOUNTER — Encounter: Payer: Self-pay | Admitting: *Deleted

## 2014-08-14 ENCOUNTER — Other Ambulatory Visit: Payer: Self-pay | Admitting: Cardiology

## 2014-08-14 ENCOUNTER — Ambulatory Visit (INDEPENDENT_AMBULATORY_CARE_PROVIDER_SITE_OTHER): Payer: Medicare Other

## 2014-08-14 DIAGNOSIS — Z5181 Encounter for therapeutic drug level monitoring: Secondary | ICD-10-CM

## 2014-08-14 DIAGNOSIS — I4891 Unspecified atrial fibrillation: Secondary | ICD-10-CM

## 2014-08-14 DIAGNOSIS — I482 Chronic atrial fibrillation, unspecified: Secondary | ICD-10-CM

## 2014-08-14 DIAGNOSIS — Z79899 Other long term (current) drug therapy: Secondary | ICD-10-CM

## 2014-08-14 LAB — POCT INR: INR: 2.2

## 2014-08-21 ENCOUNTER — Other Ambulatory Visit: Payer: Self-pay

## 2014-08-21 ENCOUNTER — Telehealth: Payer: Self-pay | Admitting: Cardiology

## 2014-08-21 DIAGNOSIS — E785 Hyperlipidemia, unspecified: Secondary | ICD-10-CM

## 2014-08-21 MED ORDER — ROSUVASTATIN CALCIUM 40 MG PO TABS: 40.0000 mg | ORAL_TABLET | Freq: Every day | ORAL | Status: DC

## 2014-08-21 NOTE — Telephone Encounter (Signed)
Sent to Dr turner patient is not on 40 mg just 20 mg

## 2014-08-21 NOTE — Telephone Encounter (Signed)
See noted from October from lipids labs.  It was recommended that he increase Crestor to 40mg  daily. OK to fill and he needs an FLP and ALT in 6 weeks

## 2014-08-21 NOTE — Telephone Encounter (Signed)
Instructed patient to INCREASE his Crestor dosage to 40 mg daily. Patient st he did not increase before because he wanted opinions from other doctors first. Lab appointment made for December 7 for FLP and ALT. Patient agrees with treatment plan.   Orders completed.

## 2014-08-21 NOTE — Telephone Encounter (Signed)
PT HAS DECIDED TO INCREASE TO THE 40 MG CRESTOR, [facility] [address], WANTS 90 DAYS TO TRY BEFORE DOING MAIL ORDER

## 2014-09-25 ENCOUNTER — Other Ambulatory Visit (INDEPENDENT_AMBULATORY_CARE_PROVIDER_SITE_OTHER): Payer: Medicare Other | Admitting: *Deleted

## 2014-09-25 ENCOUNTER — Ambulatory Visit (INDEPENDENT_AMBULATORY_CARE_PROVIDER_SITE_OTHER): Payer: Medicare Other | Admitting: *Deleted

## 2014-09-25 ENCOUNTER — Telehealth: Payer: Self-pay

## 2014-09-25 DIAGNOSIS — I10 Essential (primary) hypertension: Secondary | ICD-10-CM

## 2014-09-25 DIAGNOSIS — Z79899 Other long term (current) drug therapy: Secondary | ICD-10-CM

## 2014-09-25 DIAGNOSIS — Z5181 Encounter for therapeutic drug level monitoring: Secondary | ICD-10-CM

## 2014-09-25 DIAGNOSIS — E785 Hyperlipidemia, unspecified: Secondary | ICD-10-CM

## 2014-09-25 DIAGNOSIS — I482 Chronic atrial fibrillation, unspecified: Secondary | ICD-10-CM

## 2014-09-25 DIAGNOSIS — I4891 Unspecified atrial fibrillation: Secondary | ICD-10-CM

## 2014-09-25 DIAGNOSIS — I739 Peripheral vascular disease, unspecified: Principal | ICD-10-CM

## 2014-09-25 DIAGNOSIS — I779 Disorder of arteries and arterioles, unspecified: Secondary | ICD-10-CM

## 2014-09-25 LAB — LIPID PANEL
Cholesterol: 132 mg/dL (ref 0–200)
HDL: 39.1 mg/dL (ref >39.00)
LDL Cholesterol: 79 mg/dL (ref 0–99)
NonHDL: 92.9
TRIGLYCERIDES: 72 mg/dL (ref 0.0–149.0)
Total CHOL/HDL Ratio: 3
VLDL: 14.4 mg/dL (ref 0.0–40.0)

## 2014-09-25 LAB — POCT INR: INR: 2.4

## 2014-09-25 LAB — ALT: ALT: 28 U/L (ref 0–53)

## 2014-09-25 NOTE — Telephone Encounter (Signed)
-----   Message from Quintella Reichertraci R Turner, MD sent at 09/25/2014  2:30 PM EST ----- Patient's LDL goal is <70 - patient has HTN and carotid stenosis.  Please increase Crestor to 40mg  daily and recheck FLP and ALT in 6 weeks

## 2014-09-25 NOTE — Telephone Encounter (Signed)
Patient already taking Crestor 40 mg and wants to stay at that dose. Patient will come in 6 weeks on November 06, 2014 to recheck labs for a fasting lipid panel and ALT. Patient verbalized understanding and had no further questions.

## 2014-11-06 ENCOUNTER — Ambulatory Visit (INDEPENDENT_AMBULATORY_CARE_PROVIDER_SITE_OTHER): Payer: Medicare Other

## 2014-11-06 ENCOUNTER — Other Ambulatory Visit (INDEPENDENT_AMBULATORY_CARE_PROVIDER_SITE_OTHER): Payer: Medicare Other | Admitting: *Deleted

## 2014-11-06 DIAGNOSIS — I10 Essential (primary) hypertension: Secondary | ICD-10-CM

## 2014-11-06 DIAGNOSIS — I482 Chronic atrial fibrillation, unspecified: Secondary | ICD-10-CM

## 2014-11-06 DIAGNOSIS — I779 Disorder of arteries and arterioles, unspecified: Secondary | ICD-10-CM

## 2014-11-06 DIAGNOSIS — Z5181 Encounter for therapeutic drug level monitoring: Secondary | ICD-10-CM

## 2014-11-06 DIAGNOSIS — Z79899 Other long term (current) drug therapy: Secondary | ICD-10-CM

## 2014-11-06 DIAGNOSIS — I739 Peripheral vascular disease, unspecified: Principal | ICD-10-CM

## 2014-11-06 DIAGNOSIS — I4891 Unspecified atrial fibrillation: Secondary | ICD-10-CM

## 2014-11-06 LAB — LIPID PANEL
CHOLESTEROL: 123 mg/dL (ref 0–200)
HDL: 41.5 mg/dL (ref >39.00)
LDL CALC: 70 mg/dL (ref 0–99)
NonHDL: 81.5
Total CHOL/HDL Ratio: 3
Triglycerides: 58 mg/dL (ref 0.0–149.0)
VLDL: 11.6 mg/dL (ref 0.0–40.0)

## 2014-11-06 LAB — ALT: ALT: 19 U/L (ref 0–53)

## 2014-11-06 LAB — POCT INR: INR: 2.1

## 2014-11-27 ENCOUNTER — Other Ambulatory Visit: Payer: Self-pay | Admitting: Cardiology

## 2014-12-18 ENCOUNTER — Ambulatory Visit (INDEPENDENT_AMBULATORY_CARE_PROVIDER_SITE_OTHER): Payer: Medicare Other | Admitting: *Deleted

## 2014-12-18 DIAGNOSIS — I482 Chronic atrial fibrillation, unspecified: Secondary | ICD-10-CM

## 2014-12-18 DIAGNOSIS — Z79899 Other long term (current) drug therapy: Secondary | ICD-10-CM

## 2014-12-18 DIAGNOSIS — Z5181 Encounter for therapeutic drug level monitoring: Secondary | ICD-10-CM

## 2014-12-18 DIAGNOSIS — I4891 Unspecified atrial fibrillation: Secondary | ICD-10-CM

## 2014-12-18 LAB — POCT INR: INR: 2.3

## 2015-01-29 ENCOUNTER — Ambulatory Visit (INDEPENDENT_AMBULATORY_CARE_PROVIDER_SITE_OTHER): Payer: Medicare Other | Admitting: *Deleted

## 2015-01-29 DIAGNOSIS — I4891 Unspecified atrial fibrillation: Secondary | ICD-10-CM | POA: Diagnosis not present

## 2015-01-29 DIAGNOSIS — Z79899 Other long term (current) drug therapy: Secondary | ICD-10-CM | POA: Diagnosis not present

## 2015-01-29 DIAGNOSIS — I482 Chronic atrial fibrillation, unspecified: Secondary | ICD-10-CM

## 2015-01-29 DIAGNOSIS — Z5181 Encounter for therapeutic drug level monitoring: Secondary | ICD-10-CM

## 2015-01-29 LAB — POCT INR: INR: 2.4

## 2015-03-12 ENCOUNTER — Ambulatory Visit (INDEPENDENT_AMBULATORY_CARE_PROVIDER_SITE_OTHER): Payer: Medicare Other | Admitting: *Deleted

## 2015-03-12 DIAGNOSIS — Z5181 Encounter for therapeutic drug level monitoring: Secondary | ICD-10-CM

## 2015-03-12 DIAGNOSIS — I482 Chronic atrial fibrillation, unspecified: Secondary | ICD-10-CM

## 2015-03-12 DIAGNOSIS — I4891 Unspecified atrial fibrillation: Secondary | ICD-10-CM

## 2015-03-12 DIAGNOSIS — Z79899 Other long term (current) drug therapy: Secondary | ICD-10-CM

## 2015-03-12 LAB — POCT INR: INR: 2.4

## 2015-03-19 ENCOUNTER — Other Ambulatory Visit: Payer: Self-pay | Admitting: Cardiology

## 2015-04-23 ENCOUNTER — Other Ambulatory Visit: Payer: Self-pay | Admitting: Cardiology

## 2015-04-24 ENCOUNTER — Ambulatory Visit (INDEPENDENT_AMBULATORY_CARE_PROVIDER_SITE_OTHER): Payer: Medicare Other | Admitting: *Deleted

## 2015-04-24 DIAGNOSIS — I482 Chronic atrial fibrillation, unspecified: Secondary | ICD-10-CM

## 2015-04-24 DIAGNOSIS — Z5181 Encounter for therapeutic drug level monitoring: Secondary | ICD-10-CM | POA: Diagnosis not present

## 2015-04-24 DIAGNOSIS — Z79899 Other long term (current) drug therapy: Secondary | ICD-10-CM

## 2015-04-24 DIAGNOSIS — I4891 Unspecified atrial fibrillation: Secondary | ICD-10-CM

## 2015-04-24 LAB — POCT INR: INR: 2.4

## 2015-04-26 ENCOUNTER — Encounter: Payer: Self-pay | Admitting: Cardiology

## 2015-06-05 ENCOUNTER — Ambulatory Visit (INDEPENDENT_AMBULATORY_CARE_PROVIDER_SITE_OTHER): Payer: Medicare Other | Admitting: *Deleted

## 2015-06-05 DIAGNOSIS — Z5181 Encounter for therapeutic drug level monitoring: Secondary | ICD-10-CM

## 2015-06-05 DIAGNOSIS — I4891 Unspecified atrial fibrillation: Secondary | ICD-10-CM

## 2015-06-05 DIAGNOSIS — Z79899 Other long term (current) drug therapy: Secondary | ICD-10-CM | POA: Diagnosis not present

## 2015-06-05 DIAGNOSIS — I482 Chronic atrial fibrillation, unspecified: Secondary | ICD-10-CM

## 2015-06-05 LAB — POCT INR: INR: 2.4

## 2015-07-19 ENCOUNTER — Ambulatory Visit: Payer: Medicare Other | Admitting: Cardiology

## 2015-07-20 ENCOUNTER — Other Ambulatory Visit: Payer: Self-pay | Admitting: Pharmacist

## 2015-07-20 ENCOUNTER — Encounter: Payer: Self-pay | Admitting: Cardiology

## 2015-07-20 ENCOUNTER — Ambulatory Visit (INDEPENDENT_AMBULATORY_CARE_PROVIDER_SITE_OTHER): Payer: Medicare Other | Admitting: Pharmacist

## 2015-07-20 ENCOUNTER — Ambulatory Visit (INDEPENDENT_AMBULATORY_CARE_PROVIDER_SITE_OTHER): Payer: Medicare Other | Admitting: Cardiology

## 2015-07-20 VITALS — BP 100/70 | HR 62 | Ht 72.0 in | Wt 186.1 lb

## 2015-07-20 DIAGNOSIS — I482 Chronic atrial fibrillation, unspecified: Secondary | ICD-10-CM

## 2015-07-20 DIAGNOSIS — I779 Disorder of arteries and arterioles, unspecified: Secondary | ICD-10-CM

## 2015-07-20 DIAGNOSIS — I739 Peripheral vascular disease, unspecified: Secondary | ICD-10-CM

## 2015-07-20 DIAGNOSIS — Z5181 Encounter for therapeutic drug level monitoring: Secondary | ICD-10-CM

## 2015-07-20 DIAGNOSIS — Z79899 Other long term (current) drug therapy: Secondary | ICD-10-CM | POA: Diagnosis not present

## 2015-07-20 DIAGNOSIS — E785 Hyperlipidemia, unspecified: Secondary | ICD-10-CM

## 2015-07-20 DIAGNOSIS — I10 Essential (primary) hypertension: Secondary | ICD-10-CM

## 2015-07-20 DIAGNOSIS — I4891 Unspecified atrial fibrillation: Secondary | ICD-10-CM | POA: Diagnosis not present

## 2015-07-20 LAB — POCT INR: INR: 2.2

## 2015-07-20 MED ORDER — WARFARIN SODIUM 5 MG PO TABS: ORAL_TABLET | ORAL | Status: DC

## 2015-07-20 MED ORDER — ROSUVASTATIN CALCIUM 40 MG PO TABS: 40.0000 mg | ORAL_TABLET | Freq: Every day | ORAL | Status: DC

## 2015-07-20 MED ORDER — AMLODIPINE BESYLATE 5 MG PO TABS: 5.0000 mg | ORAL_TABLET | Freq: Every day | ORAL | Status: DC

## 2015-07-20 MED ORDER — METOPROLOL TARTRATE 50 MG PO TABS: 50.0000 mg | ORAL_TABLET | Freq: Two times a day (BID) | ORAL | Status: DC

## 2015-07-20 NOTE — Progress Notes (Signed)
Cardiology Office Note   Date:  07/20/2015   ID:  Francisco Olsen, DOB 02-06-1941, MRN [id number]  PCP:  [name], MD    Chief Complaint  Patient presents with  . Chronic Atrial Fib      History of Present Illness: This is a 73yo WM with a history of Chronic atrial fibrillation, HTN, dyslipidemia, GERD and carotid artery stenosis who presents today for followup. He is doing well. He denies any chest pain, SOB, DOE, LE edema, dizziness, palpitations or syncope.    Past Medical History  Diagnosis Date  . Diabetes mellitus without complication   . Hyperlipidemia   . Hypertension   . GERD (gastroesophageal reflux disease)   . BPH (benign prostatic hyperplasia)   . Carotid artery stenosis   . 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 Chronic atrial fibrillation     on chronic systemic anticoagulation with warfarin    Past Surgical History  Procedure Laterality Date  . Tonsilectomy, adenoidectomy, bilateral myringotomy and tubes    . Laminectomy    . Cholecystectomy    . Hernia repair    . Septoplasty    . Cardioversion x 2       Current Outpatient Prescriptions  Medication Sig Dispense Refill  . amLODipine (NORVASC) 2.5 MG tablet TAKE 2 TABLETS (5 MG TOTAL) BY MOUTH DAILY. 60 tablet 11  . CRESTOR 40 MG tablet TAKE 1 TABLET (40 MG TOTAL) BY MOUTH DAILY. 30 tablet 3  . metoprolol (LOPRESSOR) 50 MG tablet TAKE 1 TABLET BY MOUTH TWICE A DAY 60 tablet 11  . Omega-3 Fatty Acids (FISH OIL PO) Take 1,000 mg by mouth daily.     Marland Kitchen warfarin (COUMADIN) 5 MG tablet TAKE AS DIRECTED BY COUMADIN CLINIC 35 tablet 3   No current facility-administered medications for this visit.    Allergies:   Lipitor and Plavix    Social History:  The patient  reports that he has never smoked. He does not have any smokeless tobacco history on file. He reports that he does not drink alcohol or use illicit drugs.   Family History:  The patient's family history includes CAD in  his brother, father, and sister; Diabetes Mellitus II in his brother and sister.    ROS:  Please see the history of present illness.   Otherwise, review of systems are positive for none.   All other systems are reviewed and negative.    PHYSICAL EXAM: VS:  BP 100/70 mmHg  Pulse 62  Ht 6' (1.829 m)  Wt 186 lb 1.9 oz (84.423 kg)  BMI 25.24 kg/m2 , BMI Body mass index is 25.24 kg/(m^2). GEN: Well nourished, well developed, in no acute distress HEENT: normal Neck: no JVD, carotid bruits, or masses Cardiac: RRR; no murmurs, rubs, or gallops,no edema  Respiratory:  clear to auscultation bilaterally, normal work of breathing GI: soft, nontender, nondistended, + BS MS: no deformity or atrophy Skin: warm and dry, no rash Neuro:  Strength and sensation are intact Psych: euthymic mood, full affect   EKG:  EKG is ordered today. The ekg ordered today demonstrates atrial fibrillation with no ST changes   Recent Labs: 11/06/2014: ALT 19    Lipid Panel    Component Value Date/Time   CHOL 123 11/06/2014 0755   TRIG 58.0 11/06/2014 0755   HDL 41.50 11/06/2014 0755   CHOLHDL 3 11/06/2014 0755   VLDL 11.6 11/06/2014  0755   LDLCALC 70 11/06/2014 0755      Wt Readings from Last 3 Encounters:  07/20/15 186 lb 1.9 oz (84.423 kg)  07/19/14 182 lb 9.6 oz (82.827 kg)  03/05/14 181 lb 3.2 oz (82.192 kg)    ASSESSMENT AND PLAN:  1. Chronic atrial fibrillation rate controlled on BB - on chronic systemic anticoagulation with warfarin 2. HTN well controlled - continue amlodipine/BB 3. 1-39% bilateral Carotid artery stenosis - no ASA due to warfarin 4. Dyslipidemia - LDL goal <70 due to vascular disease and HTN - continue crestor - check FLP and ALT    Current medicines are reviewed at length with the patient today.  The patient does not have concerns regarding medicines.  The following changes have been made:  no change  Labs/ tests ordered today: See above Assessment and Plan No  orders of the defined types were placed in this encounter.     Disposition:   FU with me in 1 year  Signed, Quintella Reichert, MD  07/20/2015 10:07 AM    [facility] [address] [place], [place], [place]  [address] Phone: [phone]; Fax: [phone]

## 2015-07-20 NOTE — Patient Instructions (Addendum)

## 2015-08-06 ENCOUNTER — Other Ambulatory Visit: Payer: Self-pay | Admitting: Cardiology

## 2015-08-31 ENCOUNTER — Ambulatory Visit (INDEPENDENT_AMBULATORY_CARE_PROVIDER_SITE_OTHER): Payer: Medicare Other | Admitting: *Deleted

## 2015-08-31 DIAGNOSIS — Z79899 Other long term (current) drug therapy: Secondary | ICD-10-CM

## 2015-08-31 DIAGNOSIS — I4891 Unspecified atrial fibrillation: Secondary | ICD-10-CM

## 2015-08-31 DIAGNOSIS — Z5181 Encounter for therapeutic drug level monitoring: Secondary | ICD-10-CM

## 2015-08-31 DIAGNOSIS — I482 Chronic atrial fibrillation, unspecified: Secondary | ICD-10-CM

## 2015-08-31 LAB — POCT INR: INR: 2.3

## 2015-09-04 ENCOUNTER — Encounter: Payer: Self-pay | Admitting: Cardiology

## 2015-10-12 ENCOUNTER — Ambulatory Visit (INDEPENDENT_AMBULATORY_CARE_PROVIDER_SITE_OTHER): Payer: Medicare Other | Admitting: Surgery

## 2015-10-12 DIAGNOSIS — I482 Chronic atrial fibrillation, unspecified: Secondary | ICD-10-CM

## 2015-10-12 DIAGNOSIS — Z79899 Other long term (current) drug therapy: Secondary | ICD-10-CM

## 2015-10-12 DIAGNOSIS — I4891 Unspecified atrial fibrillation: Secondary | ICD-10-CM

## 2015-10-12 DIAGNOSIS — Z5181 Encounter for therapeutic drug level monitoring: Secondary | ICD-10-CM | POA: Diagnosis not present

## 2015-10-12 LAB — POCT INR: INR: 2.2

## 2015-11-23 ENCOUNTER — Ambulatory Visit (INDEPENDENT_AMBULATORY_CARE_PROVIDER_SITE_OTHER): Payer: Medicare Other | Admitting: *Deleted

## 2015-11-23 DIAGNOSIS — Z79899 Other long term (current) drug therapy: Secondary | ICD-10-CM | POA: Diagnosis not present

## 2015-11-23 DIAGNOSIS — I482 Chronic atrial fibrillation, unspecified: Secondary | ICD-10-CM

## 2015-11-23 DIAGNOSIS — Z5181 Encounter for therapeutic drug level monitoring: Secondary | ICD-10-CM

## 2015-11-23 DIAGNOSIS — I4891 Unspecified atrial fibrillation: Secondary | ICD-10-CM | POA: Diagnosis not present

## 2015-11-23 LAB — POCT INR: INR: 2.4

## 2016-01-04 ENCOUNTER — Ambulatory Visit (INDEPENDENT_AMBULATORY_CARE_PROVIDER_SITE_OTHER): Payer: Medicare Other | Admitting: *Deleted

## 2016-01-04 DIAGNOSIS — Z79899 Other long term (current) drug therapy: Secondary | ICD-10-CM

## 2016-01-04 DIAGNOSIS — I482 Chronic atrial fibrillation, unspecified: Secondary | ICD-10-CM

## 2016-01-04 DIAGNOSIS — Z5181 Encounter for therapeutic drug level monitoring: Secondary | ICD-10-CM | POA: Diagnosis not present

## 2016-01-04 DIAGNOSIS — I4891 Unspecified atrial fibrillation: Secondary | ICD-10-CM | POA: Diagnosis not present

## 2016-01-04 LAB — POCT INR: INR: 2.3

## 2016-02-15 ENCOUNTER — Ambulatory Visit (INDEPENDENT_AMBULATORY_CARE_PROVIDER_SITE_OTHER): Payer: Medicare Other | Admitting: *Deleted

## 2016-02-15 DIAGNOSIS — I482 Chronic atrial fibrillation, unspecified: Secondary | ICD-10-CM

## 2016-02-15 DIAGNOSIS — Z79899 Other long term (current) drug therapy: Secondary | ICD-10-CM | POA: Diagnosis not present

## 2016-02-15 DIAGNOSIS — Z5181 Encounter for therapeutic drug level monitoring: Secondary | ICD-10-CM | POA: Diagnosis not present

## 2016-02-15 DIAGNOSIS — I4891 Unspecified atrial fibrillation: Secondary | ICD-10-CM | POA: Diagnosis not present

## 2016-02-15 LAB — POCT INR: INR: 1.9

## 2016-03-08 ENCOUNTER — Other Ambulatory Visit: Payer: Self-pay | Admitting: Cardiology

## 2016-03-28 ENCOUNTER — Ambulatory Visit (INDEPENDENT_AMBULATORY_CARE_PROVIDER_SITE_OTHER): Payer: Medicare Other | Admitting: *Deleted

## 2016-03-28 DIAGNOSIS — Z5181 Encounter for therapeutic drug level monitoring: Secondary | ICD-10-CM | POA: Diagnosis not present

## 2016-03-28 DIAGNOSIS — I4891 Unspecified atrial fibrillation: Secondary | ICD-10-CM | POA: Diagnosis not present

## 2016-03-28 DIAGNOSIS — Z79899 Other long term (current) drug therapy: Secondary | ICD-10-CM | POA: Diagnosis not present

## 2016-03-28 DIAGNOSIS — I482 Chronic atrial fibrillation, unspecified: Secondary | ICD-10-CM

## 2016-03-28 LAB — POCT INR: INR: 2.2

## 2016-04-11 ENCOUNTER — Encounter: Payer: Self-pay | Admitting: Cardiology

## 2016-05-09 ENCOUNTER — Ambulatory Visit (INDEPENDENT_AMBULATORY_CARE_PROVIDER_SITE_OTHER): Payer: Medicare Other | Admitting: *Deleted

## 2016-05-09 DIAGNOSIS — Z79899 Other long term (current) drug therapy: Secondary | ICD-10-CM | POA: Diagnosis not present

## 2016-05-09 DIAGNOSIS — I482 Chronic atrial fibrillation, unspecified: Secondary | ICD-10-CM

## 2016-05-09 DIAGNOSIS — Z5181 Encounter for therapeutic drug level monitoring: Secondary | ICD-10-CM

## 2016-05-09 DIAGNOSIS — I4891 Unspecified atrial fibrillation: Secondary | ICD-10-CM

## 2016-05-09 LAB — POCT INR: INR: 2.2

## 2016-06-20 ENCOUNTER — Ambulatory Visit (INDEPENDENT_AMBULATORY_CARE_PROVIDER_SITE_OTHER): Payer: Medicare Other | Admitting: *Deleted

## 2016-06-20 DIAGNOSIS — I4891 Unspecified atrial fibrillation: Secondary | ICD-10-CM

## 2016-06-20 DIAGNOSIS — Z5181 Encounter for therapeutic drug level monitoring: Secondary | ICD-10-CM | POA: Diagnosis not present

## 2016-06-20 DIAGNOSIS — I482 Chronic atrial fibrillation, unspecified: Secondary | ICD-10-CM

## 2016-06-20 DIAGNOSIS — Z79899 Other long term (current) drug therapy: Secondary | ICD-10-CM | POA: Diagnosis not present

## 2016-06-20 LAB — POCT INR: INR: 2.3

## 2016-07-06 ENCOUNTER — Other Ambulatory Visit: Payer: Self-pay | Admitting: Cardiology

## 2016-07-09 ENCOUNTER — Encounter: Payer: Self-pay | Admitting: Cardiology

## 2016-07-09 NOTE — Progress Notes (Signed)
Cardiology Office Note    Date:  07/10/2016   ID:  Francisco PintoDonald Olsen, DOB 30-Jan-1941, MRN [id number]  PCP:  Lupita RaiderSHAW,KIMBERLEE, MD  Cardiologist:  [name] Turner, MD   Chief Complaint  Patient presents with  . Atrial Fibrillation  . Hyperlipidemia  . Hypertension    History of Present Illness:  Francisco Olsen is a 75 y.o. male with a history of Chronic atrial fibrillation, HTN, dyslipidemia, GERD and carotid artery stenosis who presents today for followup. He is doing well. He denies any chest pain, SOB, DOE, LE edema, dizziness, palpitations or syncope.   Past Medical History:  Diagnosis Date  . BPH (benign prostatic hyperplasia)   . Carotid artery stenosis   . Chronic atrial fibrillation (HCC)    on chronic systemic anticoagulation with warfarin  . Diabetes mellitus without complication (HCC)   . GERD (gastroesophageal reflux disease)   . 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 Hyperlipidemia   . Hypertension     Past Surgical History:  Procedure Laterality Date  . cardioversion x 2    . CHOLECYSTECTOMY    . HERNIA REPAIR    . LAMINECTOMY    . SEPTOPLASTY    . TONSILECTOMY, ADENOIDECTOMY, BILATERAL MYRINGOTOMY AND TUBES      Current Medications: Outpatient Medications Prior to Visit  Medication Sig Dispense Refill  . amLODipine (NORVASC) 5 MG tablet Take 1 tablet (5 mg total) by mouth daily. 90 tablet 3  . metoprolol (LOPRESSOR) 50 MG tablet Take 50 mg by mouth 2 (two) times daily.    . Omega-3 Fatty Acids (FISH OIL PO) Take 1,000 mg by mouth daily.     . rosuvastatin (CRESTOR) 40 MG tablet Take 40 mg by mouth daily.    Marland Kitchen. warfarin (COUMADIN) 5 MG tablet TAKE AS DIRECTED BY COUMADIN CLINIC 90 tablet 1   No facility-administered medications prior to visit.      Allergies:   Lipitor [atorvastatin] and Plavix [clopidogrel bisulfate]   Social History   Social History  . Marital status: Married    Spouse name: N/A  . Number of children: N/A  . Years of education: N/A    Social History Main Topics  . Smoking status: Never Smoker  . Smokeless tobacco: Never Used  . Alcohol use No  . Drug use: No  . Sexual activity: Not Asked   Other Topics Concern  . None   Social History Narrative  . None     Family History:  The patient's family history includes CAD in his brother, father, and sister; Diabetes Mellitus II in his brother and sister.   ROS:   Please see the history of present illness.    ROS All other systems reviewed and are negative.  No flowsheet data found.     PHYSICAL EXAM:   VS:  BP 130/80   Pulse 77   Ht 6' (1.829 m)   Wt 183 lb 6.4 oz (83.2 kg)   SpO2 98%   BMI 24.87 kg/m    GEN: Well nourished, well developed, in no acute distress  HEENT: normal  Neck: no JVD, carotid bruits, or masses Cardiac: RRR; no murmurs, rubs, or gallops,no edema.  Intact distal pulses bilaterally.  Respiratory:  clear to auscultation bilaterally, normal work of breathing GI: soft, nontender, nondistended, + BS MS: no deformity or atrophy  Skin: warm and dry, no rash Neuro:  Alert and Oriented x 3, Strength and sensation are intact Psych: euthymic mood, full affect  Wt Readings from Last  3 Encounters:  07/10/16 183 lb 6.4 oz (83.2 kg)  07/20/15 186 lb 1.9 oz (84.4 kg)  07/19/14 182 lb 9.6 oz (82.8 kg)      Studies/Labs Reviewed:   EKG:  EKG is ordered today.  The ekg ordered today demonstrates atrial fibrillation with CVR with no ST changes  Recent Labs: No results found for requested labs within last 8760 hours.   Lipid Panel    Component Value Date/Time   CHOL 123 11/06/2014 0755   TRIG 58.0 11/06/2014 0755   HDL 41.50 11/06/2014 0755   CHOLHDL 3 11/06/2014 0755   VLDL 11.6 11/06/2014 0755   LDLCALC 70 11/06/2014 0755    Additional studies/ records that were reviewed today include:  none    ASSESSMENT:    1. Chronic atrial fibrillation (HCC)   2. Essential hypertension, benign   3. Bilateral carotid artery disease  (HCC)   4. Dyslipidemia      PLAN:  In order of problems listed above:  1. Chronic atrial fibrillation with controlled VR.  Continue BB and warfarin. 2. HTN - BP controlled on current meds.  Continue amlodipine/BB. 3. Mild biilateral carotid artery stenosis 1-39%.  Continue statin.  No ASA due to warfarin. Repeat carotid dopplers.  4. Dyslipidemia - LDL goal < 70.  Continue statin. Check FLp and ALT.     Medication Adjustments/Labs and Tests Ordered: Current medicines are reviewed at length with the patient today.  Concerns regarding medicines are outlined above.  Medication changes, Labs and Tests ordered today are listed in the Patient Instructions below.  There are no Patient Instructions on file for this visit.   Signed, Armanda Magic, MD  07/10/2016 8:09 AM    [facility] [address] [place], [place], [place]  [address] Phone: [phone]; Fax: [phone]

## 2016-07-10 ENCOUNTER — Ambulatory Visit (INDEPENDENT_AMBULATORY_CARE_PROVIDER_SITE_OTHER): Payer: Medicare Other | Admitting: Cardiology

## 2016-07-10 ENCOUNTER — Encounter: Payer: Self-pay | Admitting: Cardiology

## 2016-07-10 VITALS — BP 130/80 | HR 77 | Ht 72.0 in | Wt 183.4 lb

## 2016-07-10 DIAGNOSIS — E785 Hyperlipidemia, unspecified: Secondary | ICD-10-CM | POA: Diagnosis not present

## 2016-07-10 DIAGNOSIS — I10 Essential (primary) hypertension: Secondary | ICD-10-CM

## 2016-07-10 DIAGNOSIS — I482 Chronic atrial fibrillation, unspecified: Secondary | ICD-10-CM

## 2016-07-10 DIAGNOSIS — I779 Disorder of arteries and arterioles, unspecified: Secondary | ICD-10-CM

## 2016-07-10 DIAGNOSIS — I739 Peripheral vascular disease, unspecified: Secondary | ICD-10-CM

## 2016-07-10 LAB — LIPID PANEL
CHOL/HDL RATIO: 4 ratio (ref <=5.0)
CHOLESTEROL: 141 mg/dL (ref 125–200)
HDL: 35 mg/dL — AB (ref >=40)
LDL CALC: 81 mg/dL (ref <130)
TRIGLYCERIDES: 124 mg/dL (ref <150)
VLDL: 25 mg/dL (ref <30)

## 2016-07-10 LAB — HEPATIC FUNCTION PANEL
ALT: 18 U/L (ref 9–46)
AST: 20 U/L (ref 10–35)
Albumin: 4.5 g/dL (ref 3.6–5.1)
Alkaline Phosphatase: 46 U/L (ref 40–115)
BILIRUBIN DIRECT: 0.2 mg/dL (ref <=0.2)
BILIRUBIN INDIRECT: 0.6 mg/dL (ref 0.2–1.2)
BILIRUBIN TOTAL: 0.8 mg/dL (ref 0.2–1.2)
Total Protein: 7 g/dL (ref 6.1–8.1)

## 2016-07-10 NOTE — Patient Instructions (Signed)
Medication Instructions:  Your physician recommends that you continue on your current medications as directed. Please refer to the Current Medication list given to you today.   Labwork: Your physician recommends that you have today FASTING lipid profile: lipid/lft   Testing/Procedures: Your physician has requested that you have a carotid duplex. This test is an ultrasound of the carotid arteries in your neck. It looks at blood flow through these arteries that supply the brain with blood. Allow one hour for this exam. There are no restrictions or special instructions.    Follow-Up: Your physician wants you to follow-up in: 1 year with Dr. Mayford Knifeurner.  You will receive a reminder letter in the mail two months in advance. If you don't receive a letter, please call our office to schedule the follow-up appointment.   Any Other Special Instructions Will Be Listed Below (If Applicable).     If you need a refill on your cardiac medications before your next appointment, please call your pharmacy.

## 2016-07-21 ENCOUNTER — Encounter: Payer: Self-pay | Admitting: Cardiology

## 2016-07-21 ENCOUNTER — Ambulatory Visit (HOSPITAL_COMMUNITY)
Admission: RE | Admit: 2016-07-21 | Discharge: 2016-07-21 | Disposition: A | Discharge status: Home / Self Care | Payer: Medicare Other | Source: Ambulatory Visit | Attending: Cardiovascular Disease | Admitting: Cardiovascular Disease

## 2016-07-21 DIAGNOSIS — I739 Peripheral vascular disease, unspecified: Secondary | ICD-10-CM

## 2016-07-21 DIAGNOSIS — I779 Disorder of arteries and arterioles, unspecified: Secondary | ICD-10-CM | POA: Insufficient documentation

## 2016-07-22 ENCOUNTER — Telehealth: Payer: Self-pay | Admitting: Cardiology

## 2016-07-22 NOTE — Telephone Encounter (Signed)
New message    Pt verbalized that he is returning call for rn from 07/21/16 about the results of his VAS US CAROTID

## 2016-07-22 NOTE — Telephone Encounter (Signed)
Notes Recorded by Quintella Reichertraci R Turner, MD on 07/21/2016 at 12:48 PM EDT 1-39% bilateral carotid stenosis   Left a message for the pt to call back to endorse carotid duplex results per Dr Mayford Knifeurner, as mentioned above.

## 2016-07-23 NOTE — Telephone Encounter (Signed)
-----   Message from Quintella Reichertraci R Turner, MD sent at 07/16/2016  9:58 PM EDT ----- LDL not at goal on labs from PCP.  Guidelines clearly state that with CAD the LDL goal is < 70.  Add Zetia 10mg  daily and repeat FLP and ALT in 8 weeks

## 2016-07-23 NOTE — Telephone Encounter (Signed)
-----   Message from Quintella Reichertraci R Turner, MD sent at 07/21/2016 12:48 PM EDT ----- 1-39% bilateral carotid stenosis

## 2016-07-23 NOTE — Telephone Encounter (Signed)
F/u Message  Pt call to f/u on test results. Please call back to discuss

## 2016-07-23 NOTE — Telephone Encounter (Signed)
Informed patient of carotid results and verbal understanding expressed.   Patient states he refuses to start a new medication prior to talking to Dr. Mayford Knifeurner. He refuses to go to Lipid Clinic or see APP. Scheduled patient next available with Dr. Mayford Knifeurner 12/5. Patient agrees with treatment plan.

## 2016-07-25 ENCOUNTER — Ambulatory Visit: Payer: Medicare Other | Admitting: Cardiology

## 2016-08-01 ENCOUNTER — Ambulatory Visit (INDEPENDENT_AMBULATORY_CARE_PROVIDER_SITE_OTHER): Payer: Medicare Other | Admitting: *Deleted

## 2016-08-01 DIAGNOSIS — Z5181 Encounter for therapeutic drug level monitoring: Secondary | ICD-10-CM | POA: Diagnosis not present

## 2016-08-01 DIAGNOSIS — Z79899 Other long term (current) drug therapy: Secondary | ICD-10-CM

## 2016-08-01 DIAGNOSIS — I4891 Unspecified atrial fibrillation: Secondary | ICD-10-CM | POA: Diagnosis not present

## 2016-08-01 DIAGNOSIS — I482 Chronic atrial fibrillation, unspecified: Secondary | ICD-10-CM

## 2016-08-01 LAB — POCT INR: INR: 2.2

## 2016-08-19 ENCOUNTER — Other Ambulatory Visit: Payer: Self-pay | Admitting: Cardiology

## 2016-09-05 ENCOUNTER — Ambulatory Visit (INDEPENDENT_AMBULATORY_CARE_PROVIDER_SITE_OTHER): Payer: Medicare Other | Admitting: Pharmacist Clinician (PhC)/ Clinical Pharmacy Specialist

## 2016-09-05 DIAGNOSIS — I4891 Unspecified atrial fibrillation: Secondary | ICD-10-CM | POA: Diagnosis not present

## 2016-09-05 DIAGNOSIS — Z5181 Encounter for therapeutic drug level monitoring: Secondary | ICD-10-CM | POA: Diagnosis not present

## 2016-09-05 DIAGNOSIS — Z79899 Other long term (current) drug therapy: Secondary | ICD-10-CM | POA: Diagnosis not present

## 2016-09-05 DIAGNOSIS — I482 Chronic atrial fibrillation, unspecified: Secondary | ICD-10-CM

## 2016-09-05 LAB — POCT INR: INR: 1.9

## 2016-09-11 ENCOUNTER — Other Ambulatory Visit: Payer: Self-pay | Admitting: Cardiology

## 2016-09-16 ENCOUNTER — Encounter: Payer: Self-pay | Admitting: Cardiology

## 2016-09-22 NOTE — Progress Notes (Signed)
Cardiology Office Note    Date:  09/23/2016   ID:  Francisco Olsen, DOB [birth year], MRN [id number]  PCP:  Lupita RaiderSHAW,KIMBERLEE, MD  Cardiologist:  [name] Turner, MD   Chief Complaint  Patient presents with  . Atrial Fibrillation  . Hypertension    History of Present Illness:  Francisco Olsen is a 75 y.o. male with a history of Chronic atrial fibrillation, HTN, dyslipidemia, GERD and carotid artery stenosis who presents today for followup. He is doing well. He denies any chest pain, SOB, DOE, LE edema, dizziness, palpitations or syncope.  He says that he was having muscle aches so bad that he could barely walk and had to stop the crestor and his symptoms have resolved.     Past Medical History:  Diagnosis Date  . BPH (benign prostatic hyperplasia)   . Carotid artery stenosis    1-39% bilateral by dopplers 06/2016  . Chronic atrial fibrillation (HCC)    on chronic systemic anticoagulation with warfarin  . Diabetes mellitus without complication (HCC)   . GERD (gastroesophageal reflux disease)   . 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 Hyperlipidemia   . Hypertension     Past Surgical History:  Procedure Laterality Date  . cardioversion x 2    . CHOLECYSTECTOMY    . HERNIA REPAIR    . LAMINECTOMY    . SEPTOPLASTY    . TONSILECTOMY, ADENOIDECTOMY, BILATERAL MYRINGOTOMY AND TUBES      Current Medications: Outpatient Medications Prior to Visit  Medication Sig Dispense Refill  . amLODipine (NORVASC) 5 MG tablet Take 1 tablet (5 mg total) by mouth daily. 90 tablet 2  . metoprolol (LOPRESSOR) 50 MG tablet Take 50 mg by mouth 2 (two) times daily.    . Omega-3 Fatty Acids (FISH OIL PO) Take 1,000 mg by mouth daily.     Marland Kitchen. warfarin (COUMADIN) 5 MG tablet TAKE AS DIRECTED BY COUMADIN CLINIC 90 tablet 0  . rosuvastatin (CRESTOR) 40 MG tablet Take 40 mg by mouth daily.     No facility-administered medications prior to visit.      Allergies:   Lipitor [atorvastatin] and Plavix [clopidogrel  bisulfate]   Social History   Social History  . Marital status: Married    Spouse name: N/A  . Number of children: N/A  . Years of education: N/A   Social History Main Topics  . Smoking status: Never Smoker  . Smokeless tobacco: Never Used  . Alcohol use No  . Drug use: No  . Sexual activity: Not Asked   Other Topics Concern  . None   Social History Narrative  . None     Family History:  The patient's family history includes CAD in his brother, father, and sister; Diabetes Mellitus II in his brother and sister.   ROS:   Please see the history of present illness.    ROS All other systems reviewed and are negative.  No flowsheet data found.     PHYSICAL EXAM:   VS:  BP 118/80   Pulse 65   Ht 6' (1.829 m)   Wt 183 lb 12.8 oz (83.4 kg)   SpO2 99%   BMI 24.93 kg/m    GEN: Well nourished, well developed, in no acute distress  HEENT: normal  Neck: no JVD, carotid bruits, or masses Cardiac: RRR; no murmurs, rubs, or gallops,no edema.  Intact distal pulses bilaterally.  Respiratory:  clear to auscultation bilaterally, normal work of breathing GI: soft, nontender, nondistended, + BS MS:  no deformity or atrophy  Skin: warm and dry, no rash Neuro:  Alert and Oriented x 3, Strength and sensation are intact Psych: euthymic mood, full affect  Wt Readings from Last 3 Encounters:  09/23/16 183 lb 12.8 oz (83.4 kg)  07/10/16 183 lb 6.4 oz (83.2 kg)  07/20/15 186 lb 1.9 oz (84.4 kg)      Studies/Labs Reviewed:   EKG:  EKG is ordered today.  The ekg ordered today demonstrates   Recent Labs: 07/10/2016: ALT 18   Lipid Panel    Component Value Date/Time   CHOL 141 07/10/2016 0825   TRIG 124 07/10/2016 0825   HDL 35 (L) 07/10/2016 0825   CHOLHDL 4.0 07/10/2016 0825   VLDL 25 07/10/2016 0825   LDLCALC 81 07/10/2016 0825    Additional studies/ records that were reviewed today include:  none    ASSESSMENT:    1. Permanent atrial fibrillation (HCC)   2.  Essential hypertension, benign   3. Bilateral carotid artery disease (HCC)   4. Dyslipidemia      PLAN:  In order of problems listed above:  1. Permanent atrial fibrillation - rate controlled.  Continue BB and warfarin. 2. HTN - BP controlled on current meds.  Continue amlodipine/BB 3. Bilateral carotid artery stenosis  (1-39%).   No ASA as he is on warfarin.  He is statin intolerant.  4. Dyslipidemia - LDL goal < 70.  Continue statin.  LDL is not at goal and he is now off crestor due to statin intolerance.  He would like to try diet and exercise and repeat FLP in 4 months.     Medication Adjustments/Labs and Tests Ordered: Current medicines are reviewed at length with the patient today.  Concerns regarding medicines are outlined above.  Medication changes, Labs and Tests ordered today are listed in the Patient Instructions below.  There are no Patient Instructions on file for this visit.   Signed, [name] Turner, MD  09/23/2016 8:16 AM    [facility] [address], [place], [place]  [address] Phone: [phone]([phone]; Fax: [phone]([phone]

## 2016-09-23 ENCOUNTER — Ambulatory Visit (INDEPENDENT_AMBULATORY_CARE_PROVIDER_SITE_OTHER): Payer: Medicare Other | Admitting: Cardiology

## 2016-09-23 ENCOUNTER — Ambulatory Visit (INDEPENDENT_AMBULATORY_CARE_PROVIDER_SITE_OTHER): Payer: Medicare Other | Admitting: Pharmacist

## 2016-09-23 ENCOUNTER — Encounter: Payer: Self-pay | Admitting: Cardiology

## 2016-09-23 VITALS — BP 118/80 | HR 65 | Ht 72.0 in | Wt 183.8 lb

## 2016-09-23 DIAGNOSIS — I4821 Permanent atrial fibrillation: Secondary | ICD-10-CM

## 2016-09-23 DIAGNOSIS — E785 Hyperlipidemia, unspecified: Secondary | ICD-10-CM

## 2016-09-23 DIAGNOSIS — I10 Essential (primary) hypertension: Secondary | ICD-10-CM

## 2016-09-23 DIAGNOSIS — I779 Disorder of arteries and arterioles, unspecified: Secondary | ICD-10-CM | POA: Diagnosis not present

## 2016-09-23 DIAGNOSIS — Z79899 Other long term (current) drug therapy: Secondary | ICD-10-CM | POA: Diagnosis not present

## 2016-09-23 DIAGNOSIS — I482 Chronic atrial fibrillation: Secondary | ICD-10-CM | POA: Diagnosis not present

## 2016-09-23 DIAGNOSIS — I4891 Unspecified atrial fibrillation: Secondary | ICD-10-CM

## 2016-09-23 DIAGNOSIS — Z5181 Encounter for therapeutic drug level monitoring: Secondary | ICD-10-CM

## 2016-09-23 DIAGNOSIS — I739 Peripheral vascular disease, unspecified: Secondary | ICD-10-CM

## 2016-09-23 LAB — POCT INR: INR: 2

## 2016-09-23 NOTE — Patient Instructions (Signed)
Medication Instructions:  Your physician recommends that you continue on your current medications as directed. Please refer to the Current Medication list given to you today.   Labwork: Your physician recommends that you return for fasting lab work in 4 months.   Testing/Procedures: None  Follow-Up: Your physician wants you to follow-up in: 1 year with Dr. Mayford Knifeurner. You will receive a reminder letter in the mail two months in advance. If you don't receive a letter, please call our office to schedule the follow-up appointment.   Any Other Special Instructions Will Be Listed Below (If Applicable).     If you need a refill on your cardiac medications before your next appointment, please call your pharmacy.

## 2016-10-03 ENCOUNTER — Other Ambulatory Visit: Payer: Self-pay | Admitting: Cardiology

## 2016-10-03 NOTE — Telephone Encounter (Signed)
As per last office visit, 09/23/16 patient did not tolerate this medication. I have spoke with patients wife and she verified that the patient is not taking this medication. She is aware that I will deny the request to the pharmacy. She thanked me for the call.   Discontinued Medications    Reason for Discontinue  rosuvastatin (CRESTOR) 40 MG tablet Side effect (s)

## 2016-10-06 ENCOUNTER — Other Ambulatory Visit: Payer: Self-pay | Admitting: Cardiology

## 2016-10-07 NOTE — Telephone Encounter (Signed)
Medication Detail    Disp Refills Start End   metoprolol (LOPRESSOR) 50 MG tablet 180 tablet 3 10/03/2016    Sig - Route: Take 1 tablet (50 mg total) by mouth 2 (two) times daily. - Oral   E-Prescribing Status: Receipt confirmed by pharmacy (10/03/2016 3:37 PM EST)   Pharmacy   [id number] [place], [place] - [address]

## 2016-10-27 ENCOUNTER — Encounter: Payer: Self-pay | Admitting: Cardiology

## 2016-11-04 ENCOUNTER — Ambulatory Visit (INDEPENDENT_AMBULATORY_CARE_PROVIDER_SITE_OTHER): Payer: Medicare Other | Admitting: Pharmacist

## 2016-11-04 DIAGNOSIS — I482 Chronic atrial fibrillation: Secondary | ICD-10-CM

## 2016-11-04 DIAGNOSIS — I4891 Unspecified atrial fibrillation: Secondary | ICD-10-CM

## 2016-11-04 DIAGNOSIS — I779 Disorder of arteries and arterioles, unspecified: Secondary | ICD-10-CM

## 2016-11-04 DIAGNOSIS — Z79899 Other long term (current) drug therapy: Secondary | ICD-10-CM | POA: Diagnosis not present

## 2016-11-04 DIAGNOSIS — Z5181 Encounter for therapeutic drug level monitoring: Secondary | ICD-10-CM

## 2016-11-04 DIAGNOSIS — I4821 Permanent atrial fibrillation: Secondary | ICD-10-CM

## 2016-11-04 DIAGNOSIS — I739 Peripheral vascular disease, unspecified: Principal | ICD-10-CM

## 2016-11-04 LAB — POCT INR: INR: 2.2

## 2016-11-04 NOTE — Progress Notes (Signed)
Patient ID: Francisco Olsen                 DOB: 24-Jun-1941                    MRN: [id number]     HPI: Francisco Olsen is a 76 y.o. male patient referred to lipid clinic by Dr Mayford Knife. PMH is significant for afib, HTN, HLD, DM, GERD, and carotid artery stenosis with 1-39% bilateral ICA stenosis. Pt previously refused lipid medications and follow up in the lipid clinic. He is very resistant to trying any medications for his cholesterol because he experienced myalgias with Lipitor and Crestor in the past.  Lipid panel drawn in September 2017 showed an LDL of 81 while patient was on Crestor 40mg . He stopped all statin therapy in the beginning of November. His PCP redrew labs last week and his LDL increased to a baseline of 224. Pt has a lot of questions regarding his cholesterol and remains very resistant to taking any medications, especially for his cholesterol, although his wife would like him to start medication.  Current Medications: fish oil 1g daily Intolerances: Lipitor unknown dose, Crestor 20mg  and 40mg  daily - myalgias Risk Factors: bilateral ICA stenosis, DM, HTN, age, baseline LDL > 200 LDL goal: 70mg /dL  Diet: Limits fried food and red meat.   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  Family History: CAD in his brother, father, and sister; Diabetes Mellitus II in his brother and sister.  Labs: 10/27/16: TC 303, HDL 40, TG 196, LDL 224 (no therapy) 07/10/16: TC 141, TG 124, HDL 35, LDL 81 (Crestor 40mg )  Past Medical History:  Diagnosis Date  . BPH (benign prostatic hyperplasia)   . Carotid artery stenosis    1-39% bilateral by dopplers 06/2016  . Chronic atrial fibrillation (HCC)    on chronic systemic anticoagulation with warfarin  . Diabetes mellitus without complication (HCC)   . GERD (gastroesophageal reflux disease)   . 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 Hyperlipidemia   . Hypertension     Current Outpatient Prescriptions on File Prior to Visit    Medication Sig Dispense Refill  . amLODipine (NORVASC) 5 MG tablet Take 1 tablet (5 mg total) by mouth daily. 90 tablet 2  . metoprolol (LOPRESSOR) 50 MG tablet Take 50 mg by mouth 2 (two) times daily.    . metoprolol (LOPRESSOR) 50 MG tablet Take 1 tablet (50 mg total) by mouth 2 (two) times daily. 180 tablet 3  . Omega-3 Fatty Acids (FISH OIL PO) Take 1,000 mg by mouth daily.     Marland Kitchen warfarin (COUMADIN) 5 MG tablet TAKE AS DIRECTED BY COUMADIN CLINIC 90 tablet 0   No current facility-administered medications on file prior to visit.     Allergies  Allergen Reactions  . Lipitor [Atorvastatin] Other (See Comments)    Muscle aches  . Plavix [Clopidogrel Bisulfate] Rash    Assessment/Plan:  1. Hyperlipidemia - Baseline LDL 224 far above goal <70 given bilateral ICA stenosis. Spent an hour with pt discussing benefits of statin therapy, but patient remains resistant to trying any new medications. Discussed risks of high cholesterol extensively. Pt seems very unlikely to ever agree to starting cholesterol medication. States he wants to "see what his body can do and regulate" in the next few months although denies wanting to make any big lifestyle modifications. Has an upcoming lipid panel in 3 months. Advised pt we will re-evaluate his willingness to start  therapy at that time.   Ayo Smoak E. Jorey Dollard, PharmD, CPP, BCACP [facility] [address], [place], [place] [address] Phone: [phone]; Fax: [phone] 11/04/2016 9:24 AM

## 2016-12-11 ENCOUNTER — Other Ambulatory Visit: Payer: Self-pay | Admitting: Cardiology

## 2016-12-15 ENCOUNTER — Ambulatory Visit (INDEPENDENT_AMBULATORY_CARE_PROVIDER_SITE_OTHER): Payer: Medicare Other | Admitting: *Deleted

## 2016-12-15 DIAGNOSIS — I482 Chronic atrial fibrillation: Secondary | ICD-10-CM

## 2016-12-15 DIAGNOSIS — I4821 Permanent atrial fibrillation: Secondary | ICD-10-CM

## 2016-12-15 DIAGNOSIS — Z5181 Encounter for therapeutic drug level monitoring: Secondary | ICD-10-CM

## 2016-12-15 DIAGNOSIS — Z79899 Other long term (current) drug therapy: Secondary | ICD-10-CM

## 2016-12-15 DIAGNOSIS — I4891 Unspecified atrial fibrillation: Secondary | ICD-10-CM

## 2016-12-15 LAB — POCT INR: INR: 2

## 2017-01-26 ENCOUNTER — Other Ambulatory Visit: Payer: Medicare Other | Admitting: *Deleted

## 2017-01-26 ENCOUNTER — Ambulatory Visit (INDEPENDENT_AMBULATORY_CARE_PROVIDER_SITE_OTHER): Payer: Medicare Other | Admitting: *Deleted

## 2017-01-26 ENCOUNTER — Other Ambulatory Visit: Payer: Self-pay

## 2017-01-26 DIAGNOSIS — Z5181 Encounter for therapeutic drug level monitoring: Secondary | ICD-10-CM | POA: Diagnosis not present

## 2017-01-26 DIAGNOSIS — I4821 Permanent atrial fibrillation: Secondary | ICD-10-CM

## 2017-01-26 DIAGNOSIS — I4891 Unspecified atrial fibrillation: Secondary | ICD-10-CM | POA: Diagnosis not present

## 2017-01-26 DIAGNOSIS — I482 Chronic atrial fibrillation: Secondary | ICD-10-CM | POA: Diagnosis not present

## 2017-01-26 DIAGNOSIS — E785 Hyperlipidemia, unspecified: Secondary | ICD-10-CM

## 2017-01-26 DIAGNOSIS — Z79899 Other long term (current) drug therapy: Secondary | ICD-10-CM

## 2017-01-26 LAB — HEPATIC FUNCTION PANEL
ALK PHOS: 58 IU/L (ref 39–117)
ALT: 18 IU/L (ref 0–44)
AST: 20 IU/L (ref 0–40)
Albumin: 4.2 g/dL (ref 3.5–4.8)
BILIRUBIN TOTAL: 0.6 mg/dL (ref 0.0–1.2)
Bilirubin, Direct: 0.13 mg/dL (ref 0.00–0.40)
Total Protein: 6.9 g/dL (ref 6.0–8.5)

## 2017-01-26 LAB — LIPID PANEL
CHOL/HDL RATIO: 8.9 ratio — AB (ref 0.0–5.0)
Cholesterol, Total: 293 mg/dL — ABNORMAL HIGH (ref 100–199)
HDL: 33 mg/dL — AB (ref >39)
LDL Calculated: 225 mg/dL — ABNORMAL HIGH (ref 0–99)
Triglycerides: 174 mg/dL — ABNORMAL HIGH (ref 0–149)
VLDL Cholesterol Cal: 35 mg/dL (ref 5–40)

## 2017-01-26 LAB — POCT INR: INR: 2.2

## 2017-01-28 ENCOUNTER — Telehealth: Payer: Self-pay | Admitting: Pharmacist

## 2017-01-28 NOTE — Telephone Encounter (Signed)
Spoke to patient about his cholesterol results. He states he is completely unwilling to consider statin medications due to his previous experience with muscle aching. He reports that he is VERY slowly making dietary modifications and he and his wife have lost some weight. He states he has been trying to avoid fried foods and eating more salads. We had lengthy discussion about diet and his cholesterol results. He states that he understands we work from numbers but he does not see it that way and would like to enjoy the rest of his time here on this earth not in pain. Advised we also did not want him experiencing myalgias but did want to control his cholesterol. He states he is only willing to work on his diet at this time, but will call if he changes his mind.

## 2017-03-09 ENCOUNTER — Ambulatory Visit (INDEPENDENT_AMBULATORY_CARE_PROVIDER_SITE_OTHER): Payer: Medicare Other | Admitting: *Deleted

## 2017-03-09 DIAGNOSIS — Z79899 Other long term (current) drug therapy: Secondary | ICD-10-CM | POA: Diagnosis not present

## 2017-03-09 DIAGNOSIS — I482 Chronic atrial fibrillation: Secondary | ICD-10-CM | POA: Diagnosis not present

## 2017-03-09 DIAGNOSIS — Z5181 Encounter for therapeutic drug level monitoring: Secondary | ICD-10-CM

## 2017-03-09 DIAGNOSIS — I4891 Unspecified atrial fibrillation: Secondary | ICD-10-CM

## 2017-03-09 DIAGNOSIS — I4821 Permanent atrial fibrillation: Secondary | ICD-10-CM

## 2017-03-09 LAB — POCT INR: INR: 1.9

## 2017-04-20 ENCOUNTER — Ambulatory Visit (INDEPENDENT_AMBULATORY_CARE_PROVIDER_SITE_OTHER): Payer: Medicare Other | Admitting: *Deleted

## 2017-04-20 DIAGNOSIS — Z5181 Encounter for therapeutic drug level monitoring: Secondary | ICD-10-CM | POA: Diagnosis not present

## 2017-04-20 DIAGNOSIS — Z79899 Other long term (current) drug therapy: Secondary | ICD-10-CM | POA: Diagnosis not present

## 2017-04-20 DIAGNOSIS — I4891 Unspecified atrial fibrillation: Secondary | ICD-10-CM | POA: Diagnosis not present

## 2017-04-20 DIAGNOSIS — I482 Chronic atrial fibrillation: Secondary | ICD-10-CM

## 2017-04-20 DIAGNOSIS — I4821 Permanent atrial fibrillation: Secondary | ICD-10-CM

## 2017-04-20 LAB — POCT INR: INR: 1.9

## 2017-05-04 ENCOUNTER — Ambulatory Visit (INDEPENDENT_AMBULATORY_CARE_PROVIDER_SITE_OTHER): Payer: Medicare Other

## 2017-05-04 DIAGNOSIS — I482 Chronic atrial fibrillation: Secondary | ICD-10-CM | POA: Diagnosis not present

## 2017-05-04 DIAGNOSIS — Z5181 Encounter for therapeutic drug level monitoring: Secondary | ICD-10-CM | POA: Diagnosis not present

## 2017-05-04 DIAGNOSIS — I4891 Unspecified atrial fibrillation: Secondary | ICD-10-CM

## 2017-05-04 DIAGNOSIS — Z79899 Other long term (current) drug therapy: Secondary | ICD-10-CM | POA: Diagnosis not present

## 2017-05-04 DIAGNOSIS — I4821 Permanent atrial fibrillation: Secondary | ICD-10-CM

## 2017-05-04 LAB — POCT INR: INR: 1.9

## 2017-05-12 ENCOUNTER — Other Ambulatory Visit: Payer: Self-pay | Admitting: Cardiology

## 2017-05-25 ENCOUNTER — Ambulatory Visit (INDEPENDENT_AMBULATORY_CARE_PROVIDER_SITE_OTHER): Payer: Medicare Other | Admitting: *Deleted

## 2017-05-25 DIAGNOSIS — I482 Chronic atrial fibrillation: Secondary | ICD-10-CM

## 2017-05-25 DIAGNOSIS — I4891 Unspecified atrial fibrillation: Secondary | ICD-10-CM | POA: Diagnosis not present

## 2017-05-25 DIAGNOSIS — Z79899 Other long term (current) drug therapy: Secondary | ICD-10-CM | POA: Diagnosis not present

## 2017-05-25 DIAGNOSIS — I4821 Permanent atrial fibrillation: Secondary | ICD-10-CM

## 2017-05-25 DIAGNOSIS — Z5181 Encounter for therapeutic drug level monitoring: Secondary | ICD-10-CM | POA: Diagnosis not present

## 2017-05-25 LAB — POCT INR: INR: 2.8

## 2017-06-19 ENCOUNTER — Other Ambulatory Visit: Payer: Self-pay | Admitting: Cardiology

## 2017-06-23 ENCOUNTER — Ambulatory Visit (INDEPENDENT_AMBULATORY_CARE_PROVIDER_SITE_OTHER): Payer: Medicare Other | Admitting: *Deleted

## 2017-06-23 DIAGNOSIS — Z79899 Other long term (current) drug therapy: Secondary | ICD-10-CM | POA: Diagnosis not present

## 2017-06-23 DIAGNOSIS — Z5181 Encounter for therapeutic drug level monitoring: Secondary | ICD-10-CM

## 2017-06-23 DIAGNOSIS — I4821 Permanent atrial fibrillation: Secondary | ICD-10-CM

## 2017-06-23 DIAGNOSIS — I4891 Unspecified atrial fibrillation: Secondary | ICD-10-CM | POA: Diagnosis not present

## 2017-06-23 LAB — POCT INR: INR: 2.7

## 2017-06-30 ENCOUNTER — Other Ambulatory Visit: Payer: Self-pay | Admitting: Family Medicine

## 2017-06-30 ENCOUNTER — Ambulatory Visit
Admission: RE | Admit: 2017-06-30 | Discharge: 2017-06-30 | Disposition: A | Discharge status: Home / Self Care | Payer: Medicare Other | Source: Ambulatory Visit | Attending: Family Medicine | Admitting: Family Medicine

## 2017-06-30 DIAGNOSIS — R600 Localized edema: Secondary | ICD-10-CM

## 2017-07-09 ENCOUNTER — Telehealth: Payer: Self-pay | Admitting: Pharmacist

## 2017-07-09 NOTE — Telephone Encounter (Signed)
Received fax from [facility] with request to hold Coumadin for upcoming colonoscopy on 10/2. Pt takes Coumadin for afib with CHADS2 score of 2 (HTN and DM) and CHADS2VASc of 4 (CAD, age, HTN, DM). Ok to hold Coumadin for 5 days prior to procedure. Clearance faxed to [phone].

## 2017-07-21 ENCOUNTER — Telehealth: Payer: Self-pay | Admitting: *Deleted

## 2017-07-21 NOTE — Telephone Encounter (Signed)
Spoke with pt's wife and she states he did hold coumadin for 5 days prior to colonoscopy today. She calls stating that they were instructed not to restart coumadin until Monday October 8th. This nurse called and spoke with Amber at Dr Marge Duncans office and gave above information and she is messaging Dr Bosie Clos regarding this  Efraim Kaufmann from Dr [facility] office called back and states that Dr Bosie Clos did remove 3 to 4 polyps and he states may hold coumadin for 3 days and restart coumadin on October 5th This nurse called and spoke with Mrs Neis and instructed her that Dr Bosie Clos states for him to hold coumadin for 3 days and restart on October 5th and restart at regular dose of coumadin  daily Also appt made for him to have INR rechecked in coumadin clinic on Thursday October 11th at 11:30am and she states understanding, Asked if he was having any bleeding and she states he has not told her of any bleeding

## 2017-07-30 ENCOUNTER — Ambulatory Visit (INDEPENDENT_AMBULATORY_CARE_PROVIDER_SITE_OTHER): Payer: Medicare Other | Admitting: Pharmacist

## 2017-07-30 ENCOUNTER — Telehealth: Payer: Self-pay | Admitting: Pharmacist

## 2017-07-30 DIAGNOSIS — I4821 Permanent atrial fibrillation: Secondary | ICD-10-CM

## 2017-07-30 DIAGNOSIS — I4891 Unspecified atrial fibrillation: Secondary | ICD-10-CM

## 2017-07-30 DIAGNOSIS — Z79899 Other long term (current) drug therapy: Secondary | ICD-10-CM

## 2017-07-30 DIAGNOSIS — I482 Chronic atrial fibrillation: Secondary | ICD-10-CM

## 2017-07-30 DIAGNOSIS — Z5181 Encounter for therapeutic drug level monitoring: Secondary | ICD-10-CM | POA: Diagnosis not present

## 2017-07-30 LAB — POCT INR: INR: 1.4

## 2017-07-30 NOTE — Telephone Encounter (Signed)
Discussed lipid lowering therapy at INR check today per request from Dr Mayford Knife. Baseline LDL most recently 193, has been up to 224 in the past. I have seen pt in lipid clinic before (11/04/16 visit) and spent an hour with pt discussing lipid options but he was adamant about not taking any medications for his cholesterol due to hx of myalgias on Crestor. Asked again today if he is willing to reconsider lipid lowering therapy and he states he will not take any medication for his cholesterol.

## 2017-08-11 ENCOUNTER — Ambulatory Visit (INDEPENDENT_AMBULATORY_CARE_PROVIDER_SITE_OTHER): Payer: Medicare Other | Admitting: *Deleted

## 2017-08-11 DIAGNOSIS — Z79899 Other long term (current) drug therapy: Secondary | ICD-10-CM

## 2017-08-11 DIAGNOSIS — I4891 Unspecified atrial fibrillation: Secondary | ICD-10-CM

## 2017-08-11 DIAGNOSIS — Z5181 Encounter for therapeutic drug level monitoring: Secondary | ICD-10-CM

## 2017-08-11 DIAGNOSIS — I4821 Permanent atrial fibrillation: Secondary | ICD-10-CM

## 2017-08-11 LAB — POCT INR: INR: 3

## 2017-09-01 ENCOUNTER — Ambulatory Visit (INDEPENDENT_AMBULATORY_CARE_PROVIDER_SITE_OTHER): Payer: Medicare Other | Admitting: *Deleted

## 2017-09-01 DIAGNOSIS — I4891 Unspecified atrial fibrillation: Secondary | ICD-10-CM

## 2017-09-01 DIAGNOSIS — I4821 Permanent atrial fibrillation: Secondary | ICD-10-CM

## 2017-09-01 DIAGNOSIS — Z5181 Encounter for therapeutic drug level monitoring: Secondary | ICD-10-CM

## 2017-09-01 DIAGNOSIS — Z79899 Other long term (current) drug therapy: Secondary | ICD-10-CM

## 2017-09-01 LAB — POCT INR: INR: 3.3

## 2017-10-01 ENCOUNTER — Ambulatory Visit (INDEPENDENT_AMBULATORY_CARE_PROVIDER_SITE_OTHER): Payer: Medicare Other

## 2017-10-01 ENCOUNTER — Ambulatory Visit: Payer: Medicare Other | Admitting: Cardiology

## 2017-10-01 DIAGNOSIS — I4891 Unspecified atrial fibrillation: Secondary | ICD-10-CM | POA: Diagnosis not present

## 2017-10-01 DIAGNOSIS — I482 Chronic atrial fibrillation: Secondary | ICD-10-CM

## 2017-10-01 DIAGNOSIS — Z79899 Other long term (current) drug therapy: Secondary | ICD-10-CM

## 2017-10-01 DIAGNOSIS — I4821 Permanent atrial fibrillation: Secondary | ICD-10-CM

## 2017-10-01 DIAGNOSIS — Z5181 Encounter for therapeutic drug level monitoring: Secondary | ICD-10-CM

## 2017-10-01 LAB — POCT INR: INR: 2.6

## 2017-10-01 NOTE — Patient Instructions (Signed)
Continue taking same dosage 1 tablet every day.  Recheck INR in 4 weeks.  Call us with any concerns and medications changes # [phone].

## 2017-10-02 ENCOUNTER — Other Ambulatory Visit: Payer: Self-pay | Admitting: Cardiology

## 2017-10-29 ENCOUNTER — Ambulatory Visit (INDEPENDENT_AMBULATORY_CARE_PROVIDER_SITE_OTHER): Payer: Medicare Other | Admitting: *Deleted

## 2017-10-29 DIAGNOSIS — Z79899 Other long term (current) drug therapy: Secondary | ICD-10-CM | POA: Diagnosis not present

## 2017-10-29 DIAGNOSIS — I4891 Unspecified atrial fibrillation: Secondary | ICD-10-CM

## 2017-10-29 DIAGNOSIS — Z5181 Encounter for therapeutic drug level monitoring: Secondary | ICD-10-CM | POA: Diagnosis not present

## 2017-10-29 DIAGNOSIS — I4821 Permanent atrial fibrillation: Secondary | ICD-10-CM

## 2017-10-29 LAB — POCT INR: INR: 2.8

## 2017-10-29 NOTE — Patient Instructions (Signed)
Description   Continue taking same dosage 1 tablet every day.  Recheck INR in 6 weeks.  Call us with any concerns and medications changes # [phone].     

## 2017-11-15 ENCOUNTER — Other Ambulatory Visit: Payer: Self-pay | Admitting: Cardiology

## 2017-12-06 NOTE — Progress Notes (Signed)
Cardiology Office Note:    Date:  12/07/2017   ID:  Erma Pinto, DOB 1940-11-11, MRN [id number]  PCP:  [name], MD  Cardiologist:  No primary care provider on file.    Referring MD: [name], MD   Chief Complaint  Patient presents with  . Atrial Fibrillation    History of Present Illness:    Francisco Olsen is a 77 y.o. male with a hx of chronic atrial fibrillation, HTN, dyslipidemia, GERD and carotid artery stenosis.  He is here today for followup and is doing well.  He denies any chest pain or pressure, SOB, DOE, PND, orthopnea, LE edema, dizziness, palpitations or syncope. He is compliant with his meds and is tolerating meds with no SE.     Past Medical History:  Diagnosis Date  . BPH (benign prostatic hyperplasia)   . Carotid artery stenosis    1-39% bilateral by dopplers 06/2016  . Chronic atrial fibrillation (HCC)    on chronic systemic anticoagulation with warfarin  . Diabetes mellitus without complication (HCC)   . GERD (gastroesophageal reflux disease)   . 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 Hyperlipidemia   . Hypertension     Past Surgical History:  Procedure Laterality Date  . cardioversion x 2    . CHOLECYSTECTOMY    . HERNIA REPAIR    . LAMINECTOMY    . SEPTOPLASTY    . TONSILECTOMY, ADENOIDECTOMY, BILATERAL MYRINGOTOMY AND TUBES      Current Medications: Current Meds  Medication Sig  . amLODipine (NORVASC) 5 MG tablet Take 1 tablet (5 mg total) by mouth daily. Please keep upcoming appt in February for future refills. Thank you  . metoprolol tartrate (LOPRESSOR) 50 MG tablet TAKE 1 TABLET BY MOUTH 2 TIMES DAILY.  Marland Kitchen Omega-3 Fatty Acids (FISH OIL PO) Take 1,000 mg by mouth daily.   Marland Kitchen warfarin (COUMADIN) 5 MG tablet TAKE AS DIRECTED BY COUMADIN CLINIC     Allergies:   Lipitor [atorvastatin] and Plavix [clopidogrel bisulfate]   Social History   Socioeconomic History  . Marital status: Married    Spouse name: None  . Number of children:  None  . Years of education: None  . Highest education level: None  Social Needs  . Financial resource strain: None  . Food insecurity - worry: None  . Food insecurity - inability: None  . Transportation needs - medical: None  . Transportation needs - non-medical: None  Occupational History  . None  Tobacco Use  . Smoking status: Never Smoker  . Smokeless tobacco: Never Used  Substance and Sexual Activity  . Alcohol use: No  . Drug use: No  . Sexual activity: None  Other Topics Concern  . None  Social History Narrative  . None     Family History: The patient's family history includes CAD in his brother, father, and sister; Diabetes Mellitus II in his brother and sister.  ROS:   Please see the history of present illness.    ROS  All other systems reviewed and negative.   EKGs/Labs/Other Studies Reviewed:    The following studies were reviewed today: none  EKG:  EKG is  ordered today and showed atrial fibrillation at 62bpm with no ST changes  Recent Labs: 01/26/2017: ALT 18   Recent Lipid Panel    Component Value Date/Time   CHOL 293 (H) 01/26/2017 0800   TRIG 174 (H) 01/26/2017 0800   HDL 33 (L) 01/26/2017 0800   CHOLHDL 8.9 (H) 01/26/2017 0800  CHOLHDL 4.0 07/10/2016 0825   VLDL 25 07/10/2016 0825   LDLCALC 225 (H) 01/26/2017 0800    Physical Exam:    VS:  BP (!) 144/82   Pulse 62   Ht 6' (1.829 m)   Wt 184 lb 6.4 oz (83.6 kg)   BMI 25.01 kg/m     Wt Readings from Last 3 Encounters:  12/07/17 184 lb 6.4 oz (83.6 kg)  09/23/16 183 lb 12.8 oz (83.4 kg)  07/10/16 183 lb 6.4 oz (83.2 kg)     GEN:  Well nourished, well developed in no acute distress HEENT: Normal NECK: No JVD; No carotid bruits LYMPHATICS: No lymphadenopathy CARDIAC: RRR, no murmurs, rubs, gallops RESPIRATORY:  Clear to auscultation without rales, wheezing or rhonchi  ABDOMEN: Soft, non-tender, non-distended MUSCULOSKELETAL:  No edema; No deformity  SKIN: Warm and dry NEUROLOGIC:   Alert and oriented x 3 PSYCHIATRIC:  Normal affect   ASSESSMENT:    1. Permanent atrial fibrillation (HCC)   2. Essential hypertension, benign   3. Bilateral carotid artery occlusion    PLAN:    In order of problems listed above:  1.  Permanent atrial fibrillation - he is well rate controlled on exam today at 62bpm.  He will continue on Lopressor 50mg  BID and warfarin.   2.  HTN - BP is borderline controlled on exam today.  He will continue on lopressor 50mg  BID and amlodipine 5mg  daily.    3.  Bilateral carotid artery stenosis - dopplers 07/2016 showed 1-39% stenosis.  Repeat dopplers 07/2018   Medication Adjustments/Labs and Tests Ordered: Current medicines are reviewed at length with the patient today.  Concerns regarding medicines are outlined above.  Orders Placed This Encounter  Procedures  . EKG 12-Lead   No orders of the defined types were placed in this encounter.   Signed, [name] Chapman Matteucci, MD  12/07/2017 8:42 AM    [facility]

## 2017-12-07 ENCOUNTER — Encounter: Payer: Self-pay | Admitting: Cardiology

## 2017-12-07 ENCOUNTER — Ambulatory Visit (INDEPENDENT_AMBULATORY_CARE_PROVIDER_SITE_OTHER): Payer: Medicare Other

## 2017-12-07 ENCOUNTER — Ambulatory Visit: Payer: Medicare Other | Admitting: Cardiology

## 2017-12-07 VITALS — BP 144/82 | HR 62 | Ht 72.0 in | Wt 184.4 lb

## 2017-12-07 DIAGNOSIS — Z5181 Encounter for therapeutic drug level monitoring: Secondary | ICD-10-CM

## 2017-12-07 DIAGNOSIS — I4891 Unspecified atrial fibrillation: Secondary | ICD-10-CM | POA: Diagnosis not present

## 2017-12-07 DIAGNOSIS — I4821 Permanent atrial fibrillation: Secondary | ICD-10-CM

## 2017-12-07 DIAGNOSIS — I482 Chronic atrial fibrillation: Secondary | ICD-10-CM

## 2017-12-07 DIAGNOSIS — I10 Essential (primary) hypertension: Secondary | ICD-10-CM

## 2017-12-07 DIAGNOSIS — I6523 Occlusion and stenosis of bilateral carotid arteries: Secondary | ICD-10-CM

## 2017-12-07 DIAGNOSIS — Z79899 Other long term (current) drug therapy: Secondary | ICD-10-CM | POA: Diagnosis not present

## 2017-12-07 LAB — POCT INR: INR: 2.5

## 2017-12-07 NOTE — Patient Instructions (Addendum)
Medication Instructions:  Your physician recommends that you continue on your current medications as directed. Please refer to the Current Medication list given to you today.  If you need a refill on your cardiac medications, please contact your pharmacy first.  Labwork: None ordered   Testing/Procedures: Your physician has requested that you have a carotid duplex in October 2019. This test is an ultrasound of the carotid arteries in your neck. It looks at blood flow through these arteries that supply the brain with blood. Allow one hour for this exam. There are no restrictions or special instructions.  Follow-Up: Your physician wants you to follow-up in: 1 year with Dr. Mayford Knifeurner. You will receive a reminder letter in the mail two months in advance. If you don't receive a letter, please call our office to schedule the follow-up appointment.  Any Other Special Instructions Will Be Listed Below (If Applicable).   Thank you for choosing [facility]    Lyda PeroneRena Leanah Kolander, RN  [phone]  If you need a refill on your cardiac medications before your next appointment, please call your pharmacy.

## 2017-12-07 NOTE — Patient Instructions (Signed)
Description   Continue taking same dosage 1 tablet every day.  Recheck INR in 6 weeks.  Call us with any concerns and medications changes # [phone].     

## 2017-12-13 ENCOUNTER — Other Ambulatory Visit: Payer: Self-pay | Admitting: Cardiology

## 2017-12-28 ENCOUNTER — Other Ambulatory Visit: Payer: Self-pay | Admitting: Cardiology

## 2018-01-13 NOTE — Progress Notes (Signed)
Patient ID: Francisco Olsen                 DOB: 09-Nov-1940                    MRN: [id number]     HPI: Francisco Olsen is a 77 y.o. male patient referred to lipid clinic by Dr Mayford Knife. PMH is significant for afib, HTN, HLD, DM, GERD, and carotid artery stenosis with 1-39% bilateral ICA stenosis. Pt has been seen in the lipid clinic multiple times before and has adamantly refused lipid medications due to prior myalgias with Lipitor and Crestor.  Patient presents today with his wife. Prior lipid panel drawn in September 2017 showed an LDL of 81 while patient was on Crestor 40mg . He stopped all statin therapy in the beginning of November 2017. His PCP redrew labs last month and his LDL remains at baseline > 409. Pt has a lot of questions regarding his cholesterol and remains very resistant to taking any medications, especially for his cholesterol, although his wife would like him to start medication.  Current Medications: fish oil 1g daily Intolerances: Lipitor unknown dose, Crestor 20mg  and 40mg  daily - myalgias Risk Factors: bilateral ICA stenosis, DM, HTN, age, baseline LDL > 200 LDL goal: 70mg /dL  Diet: Limits fried food and red meat.   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  Family History: CAD in his brother, father, and sister; Diabetes Mellitus II in his brother and sister.  Labs: 12/17/17: TC 284, HDL 33, TG 241, LDL 203, non-HDL 251 (fish oil 2g BID) 05/20/18: TC 303, HDL 40, TG 196, LDL 224 (no therapy) 07/10/16: TC 141, TG 124, HDL 35, LDL 81 (Crestor 40mg )  Past Medical History:  Diagnosis Date  . BPH (benign prostatic hyperplasia)   . Carotid artery stenosis    1-39% bilateral by dopplers 06/2016  . Chronic atrial fibrillation (HCC)    on chronic systemic anticoagulation with warfarin  . Diabetes mellitus without complication (HCC)   . GERD (gastroesophageal reflux disease)   . 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 Hyperlipidemia   . Hypertension      Current Outpatient Medications on File Prior to Visit  Medication Sig Dispense Refill  . amLODipine (NORVASC) 5 MG tablet Take 1 tablet (5 mg total) by mouth daily. 30 tablet 11  . metoprolol tartrate (LOPRESSOR) 50 MG tablet TAKE 1 TABLET BY MOUTH TWICE A DAY 180 tablet 3  . Omega-3 Fatty Acids (FISH OIL PO) Take 1,000 mg by mouth daily.     Marland Kitchen warfarin (COUMADIN) 5 MG tablet TAKE AS DIRECTED BY COUMADIN CLINIC 90 tablet 1   No current facility-administered medications on file prior to visit.     Allergies  Allergen Reactions  . Lipitor [Atorvastatin] Other (See Comments)    Muscle aches  . Plavix [Clopidogrel Bisulfate] Rash    Assessment/Plan:  1. Hyperlipidemia - Baseline LDL 203 far above goal <70 given bilateral ICA stenosis. I have spent multiple visits with patient discussing the benefits of lowering his cholesterol and discussing various options in detail including statin rechallenge, Zetia, or PCSK9i therapy. He remains adamantly opposed to trying any medication for his cholesterol. He has not changed his mind in the past few years that I have seen him and it is unlikely that he will agree to starting cholesterol medication.   Megan E. Supple, PharmD, CPP, BCACP [facility] [address] [place], [place] [address] Phone: [phone])  [phone]; Fax: [phone] 01/13/2018 3:15 PM

## 2018-01-14 ENCOUNTER — Ambulatory Visit: Payer: Medicare Other | Admitting: Pharmacist

## 2018-01-14 ENCOUNTER — Ambulatory Visit (INDEPENDENT_AMBULATORY_CARE_PROVIDER_SITE_OTHER): Payer: Medicare Other | Admitting: *Deleted

## 2018-01-14 DIAGNOSIS — I4891 Unspecified atrial fibrillation: Secondary | ICD-10-CM | POA: Diagnosis not present

## 2018-01-14 DIAGNOSIS — Z5181 Encounter for therapeutic drug level monitoring: Secondary | ICD-10-CM | POA: Diagnosis not present

## 2018-01-14 DIAGNOSIS — I482 Chronic atrial fibrillation: Secondary | ICD-10-CM

## 2018-01-14 DIAGNOSIS — I4821 Permanent atrial fibrillation: Secondary | ICD-10-CM

## 2018-01-14 DIAGNOSIS — Z79899 Other long term (current) drug therapy: Secondary | ICD-10-CM | POA: Diagnosis not present

## 2018-01-14 DIAGNOSIS — E785 Hyperlipidemia, unspecified: Secondary | ICD-10-CM

## 2018-01-14 LAB — POCT INR: INR: 2.6

## 2018-01-14 NOTE — Patient Instructions (Signed)
Description   Continue taking same dosage 1 tablet every day.  Recheck INR in 6 weeks.  Call us with any concerns and medications changes # [phone].     

## 2018-02-24 ENCOUNTER — Ambulatory Visit (INDEPENDENT_AMBULATORY_CARE_PROVIDER_SITE_OTHER): Payer: Medicare Other | Admitting: *Deleted

## 2018-02-24 DIAGNOSIS — I4891 Unspecified atrial fibrillation: Secondary | ICD-10-CM | POA: Diagnosis not present

## 2018-02-24 DIAGNOSIS — Z79899 Other long term (current) drug therapy: Secondary | ICD-10-CM | POA: Diagnosis not present

## 2018-02-24 DIAGNOSIS — I482 Chronic atrial fibrillation: Secondary | ICD-10-CM

## 2018-02-24 DIAGNOSIS — Z5181 Encounter for therapeutic drug level monitoring: Secondary | ICD-10-CM

## 2018-02-24 DIAGNOSIS — I4821 Permanent atrial fibrillation: Secondary | ICD-10-CM

## 2018-02-24 LAB — POCT INR: INR: 2.5

## 2018-02-24 NOTE — Patient Instructions (Signed)
Description   Continue taking same dosage 1 tablet every day.  Recheck INR in 6 weeks.  Call us with any concerns and medications changes # [phone].     

## 2018-04-07 ENCOUNTER — Ambulatory Visit: Payer: Medicare Other | Admitting: *Deleted

## 2018-04-07 DIAGNOSIS — I4821 Permanent atrial fibrillation: Secondary | ICD-10-CM

## 2018-04-07 DIAGNOSIS — Z79899 Other long term (current) drug therapy: Secondary | ICD-10-CM | POA: Diagnosis not present

## 2018-04-07 DIAGNOSIS — I4891 Unspecified atrial fibrillation: Secondary | ICD-10-CM

## 2018-04-07 DIAGNOSIS — Z5181 Encounter for therapeutic drug level monitoring: Secondary | ICD-10-CM

## 2018-04-07 LAB — POCT INR: INR: 2.7 (ref 2.0–3.0)

## 2018-04-07 NOTE — Patient Instructions (Signed)
Description   Continue taking same dosage 1 tablet every day.  Recheck INR in 6 weeks.  Call us with any concerns and medications changes # [phone].     

## 2018-05-19 ENCOUNTER — Ambulatory Visit: Payer: Medicare Other | Admitting: *Deleted

## 2018-05-19 DIAGNOSIS — Z5181 Encounter for therapeutic drug level monitoring: Secondary | ICD-10-CM

## 2018-05-19 DIAGNOSIS — Z79899 Other long term (current) drug therapy: Secondary | ICD-10-CM

## 2018-05-19 DIAGNOSIS — I4891 Unspecified atrial fibrillation: Secondary | ICD-10-CM | POA: Diagnosis not present

## 2018-05-19 DIAGNOSIS — I4821 Permanent atrial fibrillation: Secondary | ICD-10-CM

## 2018-05-19 LAB — POCT INR: INR: 2.7 (ref 2.0–3.0)

## 2018-05-19 NOTE — Patient Instructions (Signed)
Description   Continue taking same dosage 1 tablet every day.  Recheck INR in 6 weeks.  Call us with any concerns and medications changes # [phone].     

## 2018-06-30 ENCOUNTER — Ambulatory Visit: Payer: Medicare Other | Admitting: *Deleted

## 2018-06-30 DIAGNOSIS — Z5181 Encounter for therapeutic drug level monitoring: Secondary | ICD-10-CM

## 2018-06-30 DIAGNOSIS — Z79899 Other long term (current) drug therapy: Secondary | ICD-10-CM | POA: Diagnosis not present

## 2018-06-30 DIAGNOSIS — I4891 Unspecified atrial fibrillation: Secondary | ICD-10-CM

## 2018-06-30 DIAGNOSIS — I4821 Permanent atrial fibrillation: Secondary | ICD-10-CM

## 2018-06-30 LAB — POCT INR: INR: 2.9 (ref 2.0–3.0)

## 2018-06-30 NOTE — Patient Instructions (Signed)
Description   Continue taking same dosage 1 tablet every day.  Recheck INR in 6 weeks.  Call us with any concerns and medications changes # [phone].     

## 2018-07-06 ENCOUNTER — Other Ambulatory Visit: Payer: Self-pay | Admitting: *Deleted

## 2018-07-06 MED ORDER — WARFARIN SODIUM 5 MG PO TABS: ORAL_TABLET | ORAL | 1 refills | Status: DC

## 2018-08-06 ENCOUNTER — Ambulatory Visit (HOSPITAL_COMMUNITY)
Admission: RE | Admit: 2018-08-06 | Discharge: 2018-08-06 | Disposition: A | Discharge status: Home / Self Care | Payer: Medicare Other | Source: Ambulatory Visit | Attending: Cardiovascular Disease | Admitting: Cardiovascular Disease

## 2018-08-06 DIAGNOSIS — I6523 Occlusion and stenosis of bilateral carotid arteries: Secondary | ICD-10-CM | POA: Insufficient documentation

## 2018-08-11 ENCOUNTER — Ambulatory Visit: Payer: Medicare Other | Admitting: *Deleted

## 2018-08-11 DIAGNOSIS — Z79899 Other long term (current) drug therapy: Secondary | ICD-10-CM

## 2018-08-11 DIAGNOSIS — I4891 Unspecified atrial fibrillation: Secondary | ICD-10-CM

## 2018-08-11 DIAGNOSIS — Z5181 Encounter for therapeutic drug level monitoring: Secondary | ICD-10-CM

## 2018-08-11 DIAGNOSIS — I4821 Permanent atrial fibrillation: Secondary | ICD-10-CM

## 2018-08-11 LAB — POCT INR: INR: 2.7 (ref 2.0–3.0)

## 2018-08-11 NOTE — Patient Instructions (Signed)
Description   Continue taking same dosage 1 tablet every day.  Recheck INR in 6 weeks.  Call us with any concerns and medications changes # [phone].     

## 2018-09-22 ENCOUNTER — Ambulatory Visit: Payer: Medicare Other | Admitting: *Deleted

## 2018-09-22 DIAGNOSIS — Z79899 Other long term (current) drug therapy: Secondary | ICD-10-CM | POA: Diagnosis not present

## 2018-09-22 DIAGNOSIS — I4821 Permanent atrial fibrillation: Secondary | ICD-10-CM

## 2018-09-22 DIAGNOSIS — I4891 Unspecified atrial fibrillation: Secondary | ICD-10-CM

## 2018-09-22 DIAGNOSIS — Z5181 Encounter for therapeutic drug level monitoring: Secondary | ICD-10-CM

## 2018-09-22 LAB — POCT INR: INR: 3.1 — AB (ref 2.0–3.0)

## 2018-09-22 NOTE — Patient Instructions (Signed)
Description   Tomorrow take 1/2 tablet then continue taking same dosage 1 tablet every day.  Recheck INR in 6 weeks.  Call us with any concerns and medications changes # [phone].

## 2018-11-02 ENCOUNTER — Ambulatory Visit: Payer: Medicare Other | Admitting: Pharmacist

## 2018-11-02 DIAGNOSIS — Z79899 Other long term (current) drug therapy: Secondary | ICD-10-CM | POA: Diagnosis not present

## 2018-11-02 DIAGNOSIS — I4821 Permanent atrial fibrillation: Secondary | ICD-10-CM | POA: Diagnosis not present

## 2018-11-02 DIAGNOSIS — Z5181 Encounter for therapeutic drug level monitoring: Secondary | ICD-10-CM

## 2018-11-02 DIAGNOSIS — I4891 Unspecified atrial fibrillation: Secondary | ICD-10-CM | POA: Diagnosis not present

## 2018-11-02 LAB — POCT INR: INR: 2.3 (ref 2.0–3.0)

## 2018-11-02 NOTE — Patient Instructions (Signed)
Description   Continue taking same dosage 1 tablet every day.  Recheck INR in 6 weeks.  Call us with any concerns and medications changes # [phone].

## 2018-11-19 ENCOUNTER — Other Ambulatory Visit: Payer: Self-pay | Admitting: Cardiology

## 2018-12-14 ENCOUNTER — Ambulatory Visit (INDEPENDENT_AMBULATORY_CARE_PROVIDER_SITE_OTHER): Payer: Medicare Other | Admitting: *Deleted

## 2018-12-14 DIAGNOSIS — I4891 Unspecified atrial fibrillation: Secondary | ICD-10-CM | POA: Diagnosis not present

## 2018-12-14 DIAGNOSIS — Z5181 Encounter for therapeutic drug level monitoring: Secondary | ICD-10-CM

## 2018-12-14 DIAGNOSIS — Z79899 Other long term (current) drug therapy: Secondary | ICD-10-CM | POA: Diagnosis not present

## 2018-12-14 DIAGNOSIS — I4821 Permanent atrial fibrillation: Secondary | ICD-10-CM

## 2018-12-14 LAB — POCT INR: INR: 2.6 (ref 2.0–3.0)

## 2018-12-14 NOTE — Patient Instructions (Signed)
Description   Continue taking same dosage 1 tablet every day.  Recheck INR in 6 weeks.  Call us with any concerns and medications changes # [phone].

## 2018-12-16 ENCOUNTER — Other Ambulatory Visit: Payer: Self-pay | Admitting: Cardiology

## 2018-12-17 ENCOUNTER — Other Ambulatory Visit: Payer: Self-pay | Admitting: Cardiology

## 2019-01-31 IMAGING — US US EXTREM LOW VENOUS*R*
1 series · 13 of 24 positions shown · non-contrast
Comparison: None.

CLINICAL DATA: Right lower extremity edema for the past 3 weeks.
Focal area of redness involving the posteromedial aspect of the
knee. Evaluate for DVT.



[Series 1: us extrem low venous*right* · 0.07mm/px · 13 of 52 slices shown]
[im 1/52]
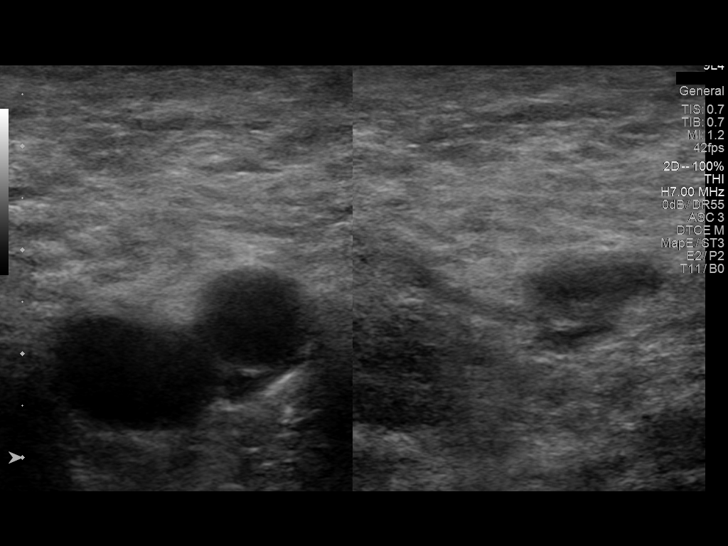
[im 5/52]
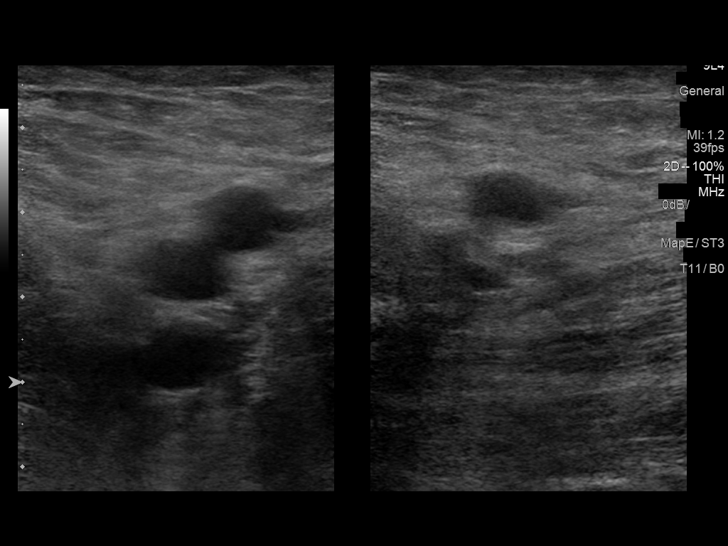
[im 9/52]
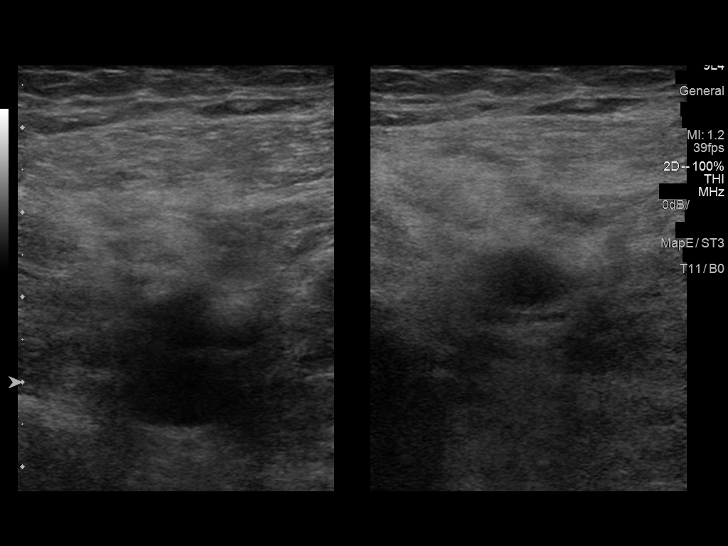
[im 14/52]
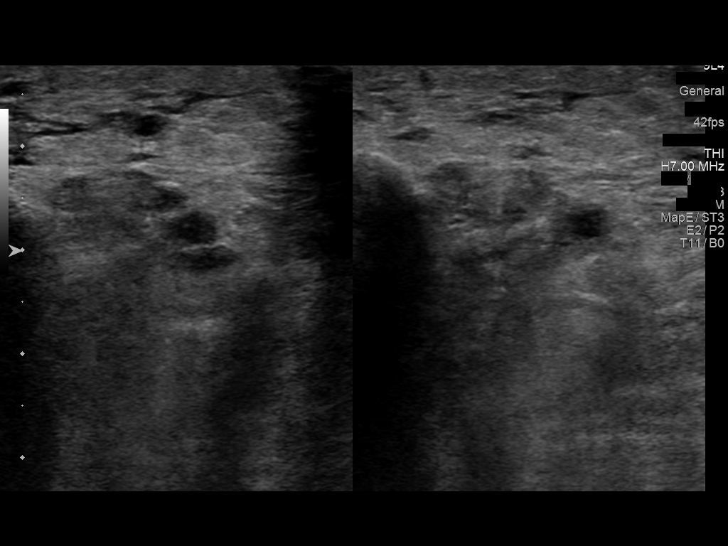
[im 18/52]
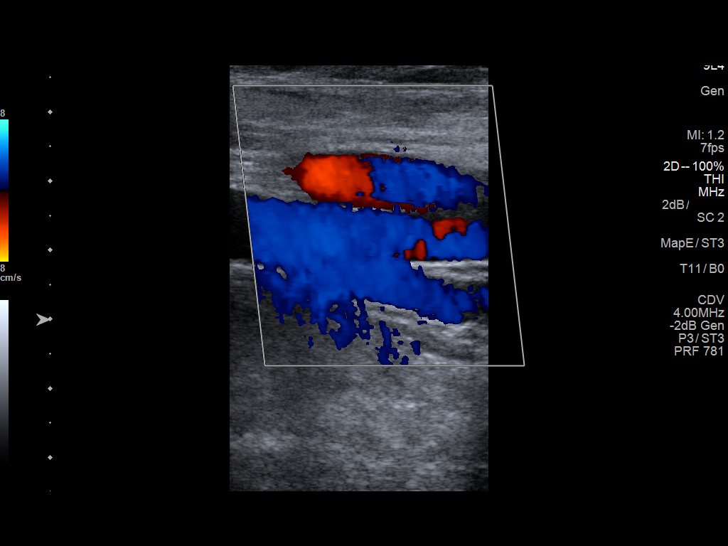
[im 23/52]
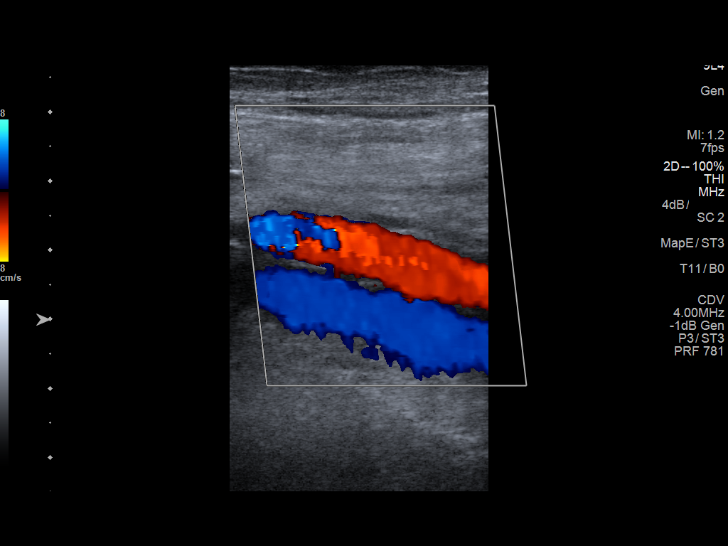
[im 27/52]
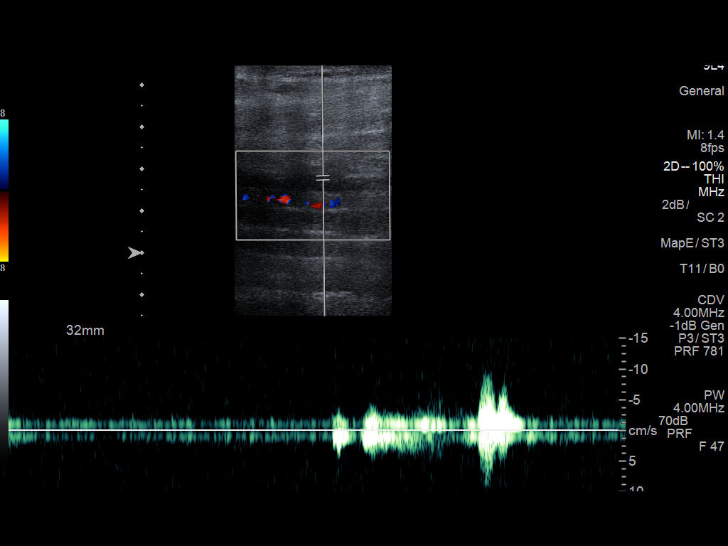
[im 29/52]
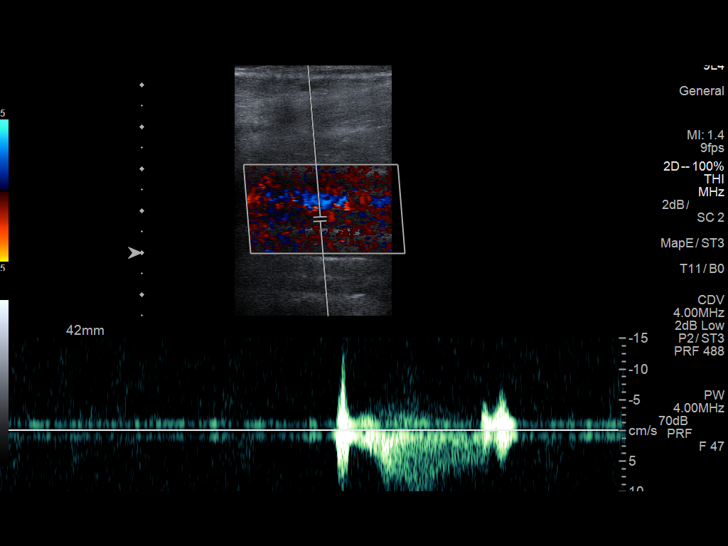
[im 34/52]
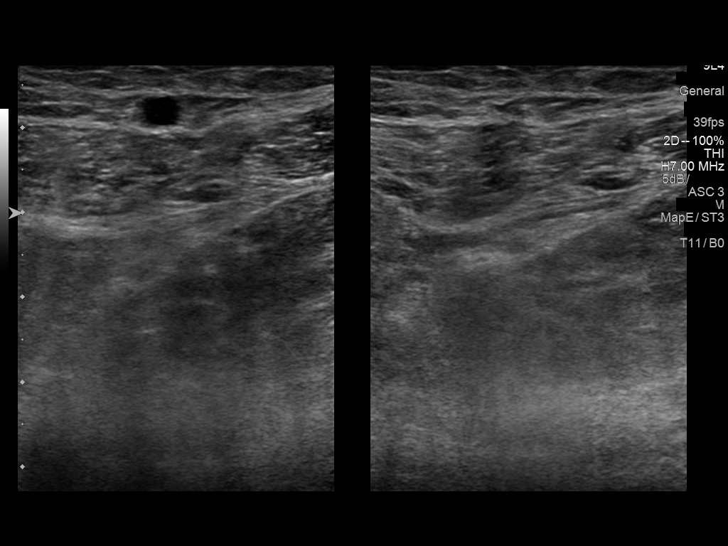
[im 38/52]
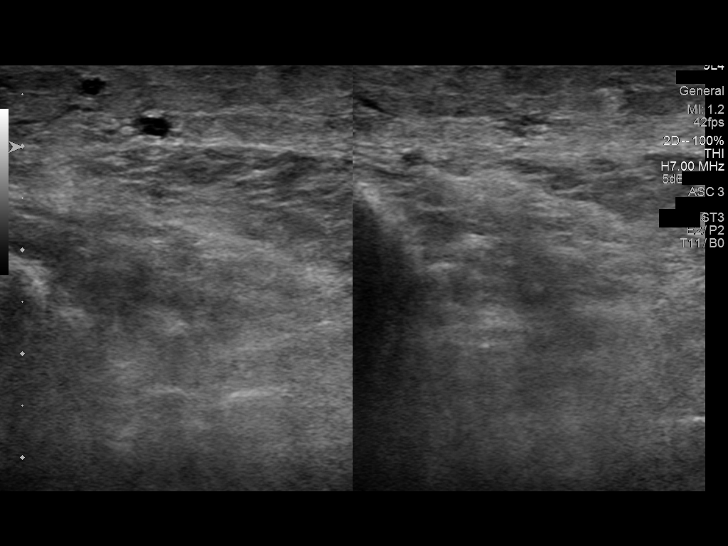
[im 43/52]
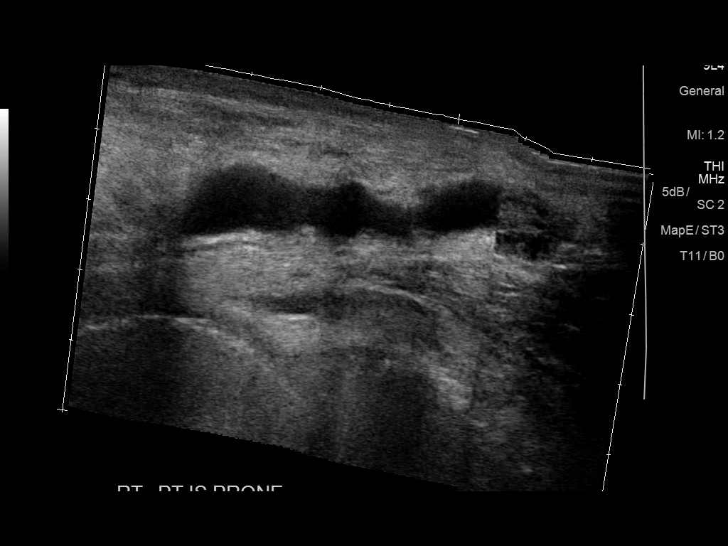
[im 47/52]
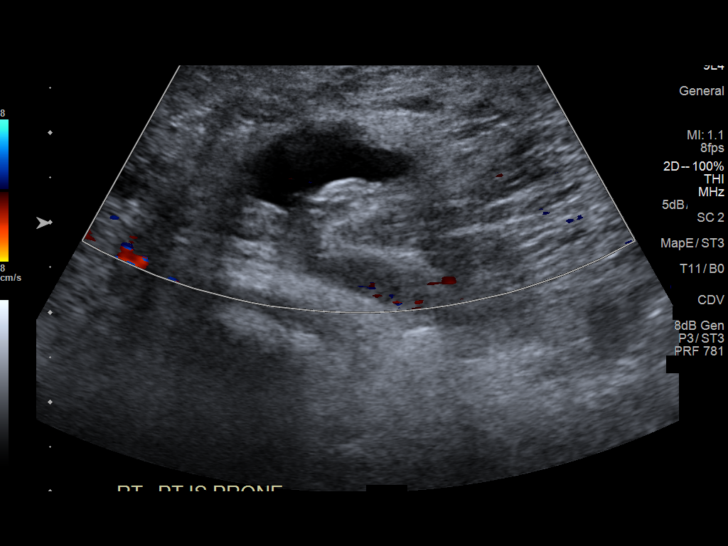
[im 52/52]
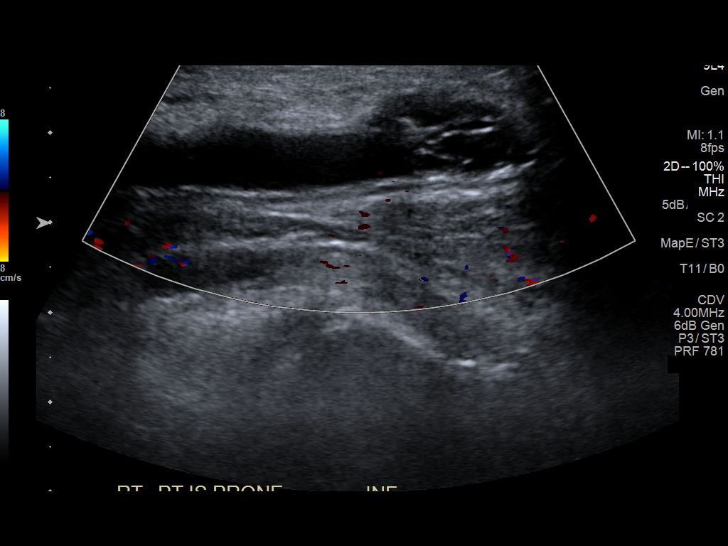

[13 of 24 positions shown; findings below may reference images not displayed]

FINDINGS: Contralateral Common Femoral Vein: Respiratory phasicity is normal
and symmetric with the symptomatic side. No evidence of thrombus.
Normal compressibility.

Common Femoral Vein: No evidence of thrombus. Normal
compressibility, respiratory phasicity and response to augmentation.

Saphenofemoral Junction: No evidence of thrombus. Normal
compressibility and flow on color Doppler imaging.

Profunda Femoral Vein: No evidence of thrombus. Normal
compressibility and flow on color Doppler imaging.

Femoral Vein: No evidence of thrombus. Normal compressibility,
respiratory phasicity and response to augmentation.

Popliteal Vein: No evidence of thrombus. Normal compressibility,
respiratory phasicity and response to augmentation.

Calf Veins: No evidence of thrombus. Normal compressibility and flow
on color Doppler imaging.

Superficial Great Saphenous Vein: No evidence of thrombus. Normal
compressibility and flow on color Doppler imaging.

Venous Reflux:  None.

Other Findings: Moderate amount of subcutaneous edema is noted at
the level the right lower leg and calf. Note is made of a
approximately 5.2 x 1.0 x 1.8 cm minimally complex though largely
anechoic serpiginous fluid collection within the right popliteal
fossa favored to represent a Baker cyst.
IMPRESSION: 1. No evidence of DVT within the right lower extremity.
2. Incidentally noted approximately 5.2 cm minimally complex fluid
collection within the right popliteal fossa favored to represent a
Baker cyst.

## 2019-02-01 ENCOUNTER — Telehealth: Payer: Self-pay | Admitting: Cardiology

## 2019-02-01 ENCOUNTER — Telehealth: Payer: Self-pay

## 2019-02-01 NOTE — Telephone Encounter (Signed)
Patient is doing fine - has reschedule from 4/15 to Aug 06/07/19.

## 2019-02-01 NOTE — Telephone Encounter (Signed)

## 2019-02-02 ENCOUNTER — Other Ambulatory Visit: Payer: Self-pay

## 2019-02-02 ENCOUNTER — Ambulatory Visit (INDEPENDENT_AMBULATORY_CARE_PROVIDER_SITE_OTHER): Payer: Medicare Other | Admitting: *Deleted

## 2019-02-02 ENCOUNTER — Ambulatory Visit: Payer: Medicare Other | Admitting: Cardiology

## 2019-02-02 DIAGNOSIS — I4821 Permanent atrial fibrillation: Secondary | ICD-10-CM

## 2019-02-02 DIAGNOSIS — Z5181 Encounter for therapeutic drug level monitoring: Secondary | ICD-10-CM

## 2019-02-02 LAB — POCT INR: INR: 2.9 (ref 2.0–3.0)

## 2019-02-02 NOTE — Patient Instructions (Signed)
Description   Spoke with pt/wife and instructed pt to continue taking same dosage 1 tablet every day.  Recheck INR in 8 weeks.  Call us with any concerns and medications changes # [phone].

## 2019-02-11 ENCOUNTER — Other Ambulatory Visit: Payer: Self-pay | Admitting: Cardiology

## 2019-03-10 ENCOUNTER — Other Ambulatory Visit: Payer: Self-pay | Admitting: Cardiology

## 2019-03-23 ENCOUNTER — Telehealth: Payer: Self-pay

## 2019-03-23 NOTE — Telephone Encounter (Signed)

## 2019-03-30 ENCOUNTER — Other Ambulatory Visit: Payer: Self-pay

## 2019-03-30 ENCOUNTER — Ambulatory Visit: Payer: Medicare Other | Admitting: *Deleted

## 2019-03-30 DIAGNOSIS — Z5181 Encounter for therapeutic drug level monitoring: Secondary | ICD-10-CM | POA: Diagnosis not present

## 2019-03-30 DIAGNOSIS — I4821 Permanent atrial fibrillation: Secondary | ICD-10-CM

## 2019-03-30 LAB — POCT INR: INR: 3.1 — AB (ref 2.0–3.0)

## 2019-03-30 NOTE — Patient Instructions (Addendum)
Description   Take half a tablet today, then continue taking same dosage 1 tablet every day. Recheck in 4 weeks. Call us with any concerns and medications changes # [phone].

## 2019-04-20 ENCOUNTER — Telehealth: Payer: Self-pay

## 2019-04-20 NOTE — Telephone Encounter (Signed)

## 2019-04-27 ENCOUNTER — Other Ambulatory Visit: Payer: Self-pay

## 2019-04-27 ENCOUNTER — Ambulatory Visit (INDEPENDENT_AMBULATORY_CARE_PROVIDER_SITE_OTHER): Payer: Medicare Other | Admitting: *Deleted

## 2019-04-27 DIAGNOSIS — Z5181 Encounter for therapeutic drug level monitoring: Secondary | ICD-10-CM

## 2019-04-27 DIAGNOSIS — I4821 Permanent atrial fibrillation: Secondary | ICD-10-CM

## 2019-04-27 LAB — POCT INR: INR: 2.3 (ref 2.0–3.0)

## 2019-04-27 NOTE — Patient Instructions (Signed)
Description   Continue taking same dosage 1 tablet every day. Recheck in 6 weeks. Call us with any concerns and medications changes # [phone].

## 2019-05-06 ENCOUNTER — Other Ambulatory Visit: Payer: Self-pay | Admitting: Cardiology

## 2019-06-04 ENCOUNTER — Other Ambulatory Visit: Payer: Self-pay | Admitting: Cardiology

## 2019-06-05 ENCOUNTER — Other Ambulatory Visit: Payer: Self-pay | Admitting: Cardiology

## 2019-06-06 ENCOUNTER — Telehealth: Payer: Self-pay | Admitting: *Deleted

## 2019-06-06 ENCOUNTER — Encounter: Payer: Self-pay | Admitting: Cardiology

## 2019-06-06 NOTE — Telephone Encounter (Signed)

## 2019-06-06 NOTE — Progress Notes (Signed)
Virtual Visit via Telephone Note   This visit type was conducted due to national recommendations for restrictions regarding the COVID-19 Pandemic (e.g. social distancing) in an effort to limit this patient's exposure and mitigate transmission in our community.  Due to his co-morbid illnesses, this patient is at least at moderate risk for complications without adequate follow up.  This format is felt to be most appropriate for this patient at this time.  All issues noted in this document were discussed and addressed.  A limited physical exam was performed with this format.  Please refer to the patient's chart for his consent to telehealth for [facility].   Evaluation Performed:  Follow-up visit  This visit type was conducted due to national recommendations for restrictions regarding the COVID-19 Pandemic (e.g. social distancing).  This format is felt to be most appropriate for this patient at this time.  All issues noted in this document were discussed and addressed.  No physical exam was performed (except for noted visual exam findings with Video Visits).  Please refer to the patient's chart (MyChart message for video visits and phone note for telephone visits) for the patient's consent to telehealth for [facility].  Date:  06/07/2019   ID:  Francisco Olsen, DOB Oct 22, 1940, MRN [id number]  Patient Location:  Home  Provider location:   [place]  PCP:  Mayra Neer, MD  Cardiologist:  Fransico Him, MD Electrophysiologist:  None   Chief Complaint:  Atrial Fibrillation  History of Present Illness:    Francisco Olsen is a 78 y.o. male who presents via audio/video conferencing for a telehealth visit today.    Francisco Olsen is a 78 y.o. male with a hx of chronic atrial fibrillation, HTN, dyslipidemia, GERD and carotid artery stenosis.  He is here today for followup and is doing well.  He denies any chest pain or pressure, SOB, DOE, PND, orthopnea, LE edema, dizziness, palpitations  or syncope. He is compliant with his meds and is tolerating meds with no SE.    The patient does not have symptoms concerning for COVID-19 infection (fever, chills, cough, or new shortness of breath).    Prior CV studies:   The following studies were reviewed today:  None  Past Medical History:  Diagnosis Date  . BPH (benign prostatic hyperplasia)   . Carotid artery stenosis    1-39% bilateral by dopplers 06/2016  . Chronic atrial fibrillation    on chronic systemic anticoagulation with warfarin  . Diabetes mellitus without complication ([place])   . GERD (gastroesophageal reflux disease)   . 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 Hyperlipidemia   . Hypertension    Past Surgical History:  Procedure Laterality Date  . cardioversion x 2    . CHOLECYSTECTOMY    . HERNIA REPAIR    . LAMINECTOMY    . SEPTOPLASTY    . TONSILECTOMY, ADENOIDECTOMY, BILATERAL MYRINGOTOMY AND TUBES       Current Meds  Medication Sig  . amLODipine (NORVASC) 5 MG tablet Take 1 tablet (5 mg total) by mouth daily. Please keep upcoming appt for more future refills.  . metoprolol tartrate (LOPRESSOR) 50 MG tablet TAKE 1 TABLET BY MOUTH TWICE A DAY  . Omega-3 Fatty Acids (FISH OIL PO) Take 1,000 mg by mouth daily.   Marland Kitchen warfarin (COUMADIN) 5 MG tablet TAKE AS DIRECTED BY COUMADIN CLINIC  . [DISCONTINUED] amLODipine (NORVASC) 5 MG tablet TAKE 1 TABLET (5 MG TOTAL) BY MOUTH DAILY. PLEASE KEEP UPCOMING APPT FOR MORE FUTURE  REFILLS.     Allergies:   Crestor [rosuvastatin calcium], Lipitor [atorvastatin], and Plavix [clopidogrel bisulfate]   Social History   Tobacco Use  . Smoking status: Never Smoker  . Smokeless tobacco: Never Used  Substance Use Topics  . Alcohol use: No  . Drug use: No     Family Hx: The patient's family history includes CAD in his brother, father, and sister; Diabetes Mellitus II in his brother and sister.  ROS:   Please see the history of present illness.     All other systems reviewed  and are negative.   Labs/Other Tests and Data Reviewed:    Recent Labs: No results found for requested labs within last 8760 hours.   Recent Lipid Panel Lab Results  Component Value Date/Time   CHOL 293 (H) 01/26/2017 08:00 AM   TRIG 174 (H) 01/26/2017 08:00 AM   HDL 33 (L) 01/26/2017 08:00 AM   CHOLHDL 8.9 (H) 01/26/2017 08:00 AM   CHOLHDL 4.0 07/10/2016 08:25 AM   LDLCALC 225 (H) 01/26/2017 08:00 AM    Wt Readings from Last 3 Encounters:  06/06/19 176 lb (79.8 kg)  12/07/17 184 lb 6.4 oz (83.6 kg)  09/23/16 183 lb 12.8 oz (83.4 kg)     Objective:    Vital Signs:  BP 129/77   Pulse 82   Wt 176 lb (79.8 kg)   BMI 23.87 kg/m     ASSESSMENT & PLAN:    1.  Permanent atrial fibrillation - he has not had any palpitations and his HR is controlled.  He will continue on BB for rate control and warfarin.  He has not had any bleeding problems.   2.  Hypertension - is BP is well controlled at home.  He will continue on Lopressor 50mg  BID and amlodipine 5mg  daily.    3.  Bilateral carotid artery stenosis - dopplers 07/2018 showed stable bilateral carotid artery stenosis 1-39%.   He is not on ASA due to warfarin.   4.  Hyperlipidemia - his last LDL in 2018 was markedly elevated at 225 and he has been adamant in the past he does not want it treated. He has refused statin therapy in the past after seeing the lipid clinic on multiple occasions.  He is against statins, Zetia and PCSK9i so will not retest lipids.   COVID-19 Education: The signs and symptoms of COVID-19 were discussed with the patient and how to seek care for testing (follow up with PCP or arrange E-visit).  The importance of social distancing was discussed today.  Patient Risk:   After full review of this patient's clinical status, I feel that they are at least moderate risk at this time.  Time:   Today, I have spent 20 minutes  on telemedicine discussing medical problems including Afib, Lipids, HTN, carotid  stenosis.  We also reviewed the symptoms of COVID 19 and the ways to protect against contracting the virus with telehealth technology.  I spent an additional 5 minutes reviewing patient's chart including labs, carotid dopplers.  Medication Adjustments/Labs and Tests Ordered: Current medicines are reviewed at length with the patient today.  Concerns regarding medicines are outlined above.  Tests Ordered: No orders of the defined types were placed in this encounter.  Medication Changes: Meds ordered this encounter  Medications  . amLODipine (NORVASC) 5 MG tablet    Sig: Take 1 tablet (5 mg total) by mouth daily. Please keep upcoming appt for more future refills.    Dispense:  90  tablet    Refill:  3    Disposition:  Follow up in 1 year(s)  Signed, [name] , MD  06/07/2019 9:40 AM    [facility]

## 2019-06-07 ENCOUNTER — Other Ambulatory Visit: Payer: Self-pay

## 2019-06-07 ENCOUNTER — Ambulatory Visit (INDEPENDENT_AMBULATORY_CARE_PROVIDER_SITE_OTHER): Payer: Medicare Other | Admitting: *Deleted

## 2019-06-07 ENCOUNTER — Telehealth (INDEPENDENT_AMBULATORY_CARE_PROVIDER_SITE_OTHER): Payer: Medicare Other | Admitting: Cardiology

## 2019-06-07 VITALS — BP 129/77 | HR 82 | Wt 176.0 lb

## 2019-06-07 DIAGNOSIS — I6523 Occlusion and stenosis of bilateral carotid arteries: Secondary | ICD-10-CM | POA: Diagnosis not present

## 2019-06-07 DIAGNOSIS — I4821 Permanent atrial fibrillation: Secondary | ICD-10-CM | POA: Diagnosis not present

## 2019-06-07 DIAGNOSIS — I10 Essential (primary) hypertension: Secondary | ICD-10-CM | POA: Diagnosis not present

## 2019-06-07 DIAGNOSIS — E785 Hyperlipidemia, unspecified: Secondary | ICD-10-CM

## 2019-06-07 DIAGNOSIS — Z5181 Encounter for therapeutic drug level monitoring: Secondary | ICD-10-CM

## 2019-06-07 LAB — POCT INR: INR: 2.7 (ref 2.0–3.0)

## 2019-06-07 MED ORDER — AMLODIPINE BESYLATE 5 MG PO TABS: 5.0000 mg | ORAL_TABLET | Freq: Every day | ORAL | 3 refills | Status: DC

## 2019-06-07 NOTE — Patient Instructions (Signed)
Description   Continue taking same dosage 1 tablet every day. Recheck in 6 weeks. Call us with any concerns and medications changes # [phone].      

## 2019-07-19 ENCOUNTER — Other Ambulatory Visit: Payer: Self-pay

## 2019-07-19 ENCOUNTER — Ambulatory Visit (INDEPENDENT_AMBULATORY_CARE_PROVIDER_SITE_OTHER): Payer: Medicare Other | Admitting: *Deleted

## 2019-07-19 DIAGNOSIS — Z5181 Encounter for therapeutic drug level monitoring: Secondary | ICD-10-CM

## 2019-07-19 DIAGNOSIS — I4821 Permanent atrial fibrillation: Secondary | ICD-10-CM

## 2019-07-19 LAB — POCT INR: INR: 3.2 — AB (ref 2.0–3.0)

## 2019-07-19 NOTE — Patient Instructions (Signed)
Description   Today take 1/2 tablet then continue taking same dosage 1 tablet every day. Recheck in 6 weeks. Call us with any concerns and medications changes # [phone].

## 2019-08-16 ENCOUNTER — Other Ambulatory Visit: Payer: Self-pay | Admitting: Family Medicine

## 2019-08-16 DIAGNOSIS — R319 Hematuria, unspecified: Secondary | ICD-10-CM

## 2019-08-22 ENCOUNTER — Other Ambulatory Visit: Payer: Self-pay

## 2019-08-22 ENCOUNTER — Ambulatory Visit
Admission: RE | Admit: 2019-08-22 | Discharge: 2019-08-22 | Disposition: A | Discharge status: Home / Self Care | Payer: Medicare Other | Source: Ambulatory Visit | Attending: Family Medicine | Admitting: Family Medicine

## 2019-08-22 DIAGNOSIS — R319 Hematuria, unspecified: Secondary | ICD-10-CM

## 2019-08-22 MED ORDER — IOPAMIDOL (ISOVUE-300) INJECTION 61%
100.0000 mL | Freq: Once | INTRAVENOUS | Status: AC | PRN
  Administered 2019-08-22: 09:00:00 100 mL via INTRAVENOUS

## 2019-08-30 ENCOUNTER — Other Ambulatory Visit: Payer: Self-pay

## 2019-08-30 ENCOUNTER — Ambulatory Visit: Payer: Medicare Other

## 2019-08-30 DIAGNOSIS — Z5181 Encounter for therapeutic drug level monitoring: Secondary | ICD-10-CM

## 2019-08-30 DIAGNOSIS — I4821 Permanent atrial fibrillation: Secondary | ICD-10-CM | POA: Diagnosis not present

## 2019-08-30 LAB — POCT INR: INR: 2.6 (ref 2.0–3.0)

## 2019-08-30 NOTE — Patient Instructions (Signed)
Description   Continue taking same dosage 1 tablet every day. Recheck in 6 weeks. Call us with any concerns and medications changes # [phone].

## 2019-09-26 ENCOUNTER — Other Ambulatory Visit: Payer: Self-pay | Admitting: Cardiology

## 2019-10-11 ENCOUNTER — Other Ambulatory Visit: Payer: Self-pay

## 2019-10-11 ENCOUNTER — Ambulatory Visit: Payer: Medicare Other | Admitting: *Deleted

## 2019-10-11 DIAGNOSIS — I4821 Permanent atrial fibrillation: Secondary | ICD-10-CM | POA: Diagnosis not present

## 2019-10-11 DIAGNOSIS — Z5181 Encounter for therapeutic drug level monitoring: Secondary | ICD-10-CM

## 2019-10-11 LAB — POCT INR: INR: 3.1 — AB (ref 2.0–3.0)

## 2019-10-11 NOTE — Patient Instructions (Signed)
Description   Tonight take 1/2 tablet then continue taking same dosage 1 tablet every day. Recheck in 6 weeks. Call us with any concerns and medications changes # [phone].

## 2019-11-22 ENCOUNTER — Ambulatory Visit (INDEPENDENT_AMBULATORY_CARE_PROVIDER_SITE_OTHER): Payer: Medicare Other | Admitting: *Deleted

## 2019-11-22 ENCOUNTER — Other Ambulatory Visit: Payer: Self-pay

## 2019-11-22 DIAGNOSIS — Z5181 Encounter for therapeutic drug level monitoring: Secondary | ICD-10-CM | POA: Diagnosis not present

## 2019-11-22 DIAGNOSIS — I4821 Permanent atrial fibrillation: Secondary | ICD-10-CM | POA: Diagnosis not present

## 2019-11-22 LAB — POCT INR: INR: 2.8 (ref 2.0–3.0)

## 2019-11-22 NOTE — Patient Instructions (Signed)
Description   Continue taking 1 tablet every day. Recheck in 6 weeks. Call us with any concerns and medications changes # [phone].      

## 2019-11-23 ENCOUNTER — Other Ambulatory Visit: Payer: Self-pay | Admitting: Family Medicine

## 2019-11-23 DIAGNOSIS — R599 Enlarged lymph nodes, unspecified: Secondary | ICD-10-CM

## 2019-12-06 ENCOUNTER — Other Ambulatory Visit: Payer: Self-pay

## 2019-12-06 ENCOUNTER — Ambulatory Visit
Admission: RE | Admit: 2019-12-06 | Discharge: 2019-12-06 | Disposition: A | Discharge status: Home / Self Care | Payer: Medicare Other | Source: Ambulatory Visit | Attending: Family Medicine | Admitting: Family Medicine

## 2019-12-06 DIAGNOSIS — R599 Enlarged lymph nodes, unspecified: Secondary | ICD-10-CM

## 2019-12-06 MED ORDER — IOPAMIDOL (ISOVUE-300) INJECTION 61%
100.0000 mL | Freq: Once | INTRAVENOUS | Status: AC | PRN
  Administered 2019-12-06: 100 mL via INTRAVENOUS

## 2020-01-03 ENCOUNTER — Ambulatory Visit: Payer: Medicare Other | Admitting: *Deleted

## 2020-01-03 ENCOUNTER — Other Ambulatory Visit: Payer: Self-pay

## 2020-01-03 DIAGNOSIS — Z5181 Encounter for therapeutic drug level monitoring: Secondary | ICD-10-CM | POA: Diagnosis not present

## 2020-01-03 DIAGNOSIS — I4821 Permanent atrial fibrillation: Secondary | ICD-10-CM | POA: Diagnosis not present

## 2020-01-03 LAB — POCT INR: INR: 2.6 (ref 2.0–3.0)

## 2020-01-03 NOTE — Patient Instructions (Signed)
Description   Continue taking 1 tablet every day. Recheck in 6 weeks. Call us with any concerns and medications changes # [phone].

## 2020-02-14 ENCOUNTER — Ambulatory Visit (INDEPENDENT_AMBULATORY_CARE_PROVIDER_SITE_OTHER): Payer: Medicare Other | Admitting: *Deleted

## 2020-02-14 ENCOUNTER — Other Ambulatory Visit: Payer: Self-pay

## 2020-02-14 DIAGNOSIS — Z5181 Encounter for therapeutic drug level monitoring: Secondary | ICD-10-CM | POA: Diagnosis not present

## 2020-02-14 DIAGNOSIS — I4821 Permanent atrial fibrillation: Secondary | ICD-10-CM | POA: Diagnosis not present

## 2020-02-14 LAB — POCT INR: INR: 2.5 (ref 2.0–3.0)

## 2020-02-14 NOTE — Patient Instructions (Signed)
Description   Continue taking 1 tablet every day. Recheck in 6 weeks. Call us with any concerns and medications changes # [phone].

## 2020-02-27 ENCOUNTER — Other Ambulatory Visit: Payer: Self-pay | Admitting: Surgery

## 2020-02-28 ENCOUNTER — Telehealth: Payer: Self-pay

## 2020-02-28 NOTE — Telephone Encounter (Signed)
Pt has been scheduled to see Chelsea Aus, [facility] 02/29/20 11:15 for pre op clearance. Pt thanked me for the call. I will forward clearance notes to [facility] for upcoming appt. I will remove from the pre op call back pool.

## 2020-02-28 NOTE — Telephone Encounter (Signed)
   [facility] Pre-operative Risk Assessment    Request for surgical clearance:  1. What type of surgery is being performed?  Left inguinal Lymph node biopsy    2. When is this surgery scheduled? TBD   3. What type of clearance is required (medical clearance vs. Pharmacy clearance to hold med vs. Both)?  Both   4. Are there any medications that need to be held prior to surgery and how long? Please give instruction as to how patient should hold Warfarin    5. Practice name and name of physician performing surgery? [facility], Dr. Coralie Olsen    6. What is your office phone number [phone]    7.   What is your office fax number [phone] Attn: Francisco Olsen, CMA   8.   Anesthesia type (None, local, MAC, general) ? General    Francisco Olsen 02/28/2020, 10:42 AM  _________________________________________________________________   (provider comments below)

## 2020-02-28 NOTE — Telephone Encounter (Signed)
FYI - here are pharm's recs on the warfarin already laid out when you see patient for clearance.

## 2020-02-28 NOTE — Telephone Encounter (Signed)
Patient with diagnosis of afib on warfarin for anticoagulation.    Procedure: Left inguinal Lymph node biopsy   Date of procedure: TBD  CHADS2-VASc score of  4 (HTN, AGE, DM2, AGE)  CrCl 87 ml/min  Per office protocol, patient can hold warfarin for 5 days prior to procedure.    Patient will NOT need bridging with Lovenox (enoxaparin) around procedure.

## 2020-02-28 NOTE — Telephone Encounter (Signed)
   Primary Cardiologist:Traci Turner, MD  Chart reviewed as part of pre-operative protocol coverage. Because of Francisco Olsen past medical history and time since last visit, he/she will require a follow-up visit in order to better assess preoperative cardiovascular risk. He has history of hx of chronic atrial fibrillation, HTN, dyslipidemia, GERD and carotid artery stenosis. He had a telephone visit in 05/2019, but has not had in-person visit/EKG since 11/2017 - given general anesthesia, age and untreated HLD would recommend pre-op clearance OV.  Pre-op covering staff: - Please schedule appointment and call patient to inform them. - Please contact requesting surgeon's office via preferred method (i.e, phone, fax) to inform them of need for appointment prior to surgery.  This message will also be routed to pharmacy pool for input on holding anticoagulant as requested below so that this information is available at time of patient's appointment.   Laurann Montana, PA-C  02/28/2020, 12:22 PM

## 2020-02-29 ENCOUNTER — Other Ambulatory Visit: Payer: Self-pay

## 2020-02-29 ENCOUNTER — Encounter: Payer: Self-pay | Admitting: Physician Assistant

## 2020-02-29 ENCOUNTER — Ambulatory Visit: Payer: Medicare Other | Admitting: Physician Assistant

## 2020-02-29 VITALS — BP 130/80 | HR 60 | Ht 72.0 in | Wt 186.4 lb

## 2020-02-29 DIAGNOSIS — Z01818 Encounter for other preprocedural examination: Secondary | ICD-10-CM

## 2020-02-29 DIAGNOSIS — R600 Localized edema: Secondary | ICD-10-CM | POA: Diagnosis not present

## 2020-02-29 DIAGNOSIS — E785 Hyperlipidemia, unspecified: Secondary | ICD-10-CM

## 2020-02-29 DIAGNOSIS — I4821 Permanent atrial fibrillation: Secondary | ICD-10-CM

## 2020-02-29 DIAGNOSIS — I10 Essential (primary) hypertension: Secondary | ICD-10-CM | POA: Diagnosis not present

## 2020-02-29 NOTE — Progress Notes (Signed)
Cardiology Office Note    Date:  02/29/2020   ID:  Sigifredo Pignato, DOB Aug 25, 1941, MRN [id number]  PCP:  [name], MD  Cardiologist:  Dr. Mayford Knife   Chief Complaint: Surgical clearance for  Left inguinal Lymph node biopsy    History of Present Illness:   Francisco Olsen is a 79 y.o. male with history of persistent atrial fibrillation, carotid artery stenosis, hypertension, hyperlipidemia with statin intolerance (declined PCSK9i) and GERD presents for surgical clearance.  Carotid Doppler October 2019 showed a stable bilateral disease at 1 to 39%. Last seen by Dr. Mayford Knife virtually August 2020.   Patient is here for leg inguinal lymph node biopsy.  Patient also had a large prostate.  Reviewed CT of pelvis done in February 2021.  Patient is very active doing multiple projects and heavy lifting at home without chest pain or shortness of breath.  Last year patient injured his left leg and since then noted left lower extremity edema.  No evaluation.  Compliant with warfarin.  Denies chest pain, shortness of breath, orthopnea, PND, syncope, melena or blood in his stool or urine.   Past Medical History:  Diagnosis Date  . BPH (benign prostatic hyperplasia)   . Carotid artery stenosis    1-39% bilateral by dopplers 06/2016  . Chronic atrial fibrillation ([facility])    on chronic systemic anticoagulation with warfarin  . Diabetes mellitus without complication ([facility])   . GERD (gastroesophageal reflux disease)   . 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 Hyperlipidemia   . Hypertension     Past Surgical History:  Procedure Laterality Date  . cardioversion x 2    . CHOLECYSTECTOMY    . HERNIA REPAIR    . LAMINECTOMY    . SEPTOPLASTY    . TONSILECTOMY, ADENOIDECTOMY, BILATERAL MYRINGOTOMY AND TUBES      Current Medications: Prior to Admission medications   Medication Sig Start Date End Date Taking? Authorizing Provider  amLODipine (NORVASC) 5 MG tablet Take 1 tablet (5 mg total) by mouth daily.  Please keep upcoming appt for more future refills. 06/07/19   Quintella Reichert, MD  metoprolol tartrate (LOPRESSOR) 50 MG tablet TAKE 1 TABLET BY MOUTH TWICE A DAY 03/10/19   Turner, Cornelious Bryant, MD  Omega-3 Fatty Acids (FISH OIL PO) Take 1,000 mg by mouth daily.     [provider]  warfarin (COUMADIN) 5 MG tablet TAKE 1 TABLET DAILY OR AS DIRECTED BY COUMADIN CLINIC 09/26/19   Quintella Reichert, MD    Allergies:   Crestor [rosuvastatin calcium], Lipitor [atorvastatin], and Plavix [clopidogrel bisulfate]   Social History   Socioeconomic History  . Marital status: Married    Spouse name: Not on file  . Number of children: Not on file  . Years of education: Not on file  . Highest education level: Not on file  Occupational History  . Not on file  Tobacco Use  . Smoking status: Never Smoker  . Smokeless tobacco: Never Used  Substance and Sexual Activity  . Alcohol use: No  . Drug use: No  . Sexual activity: Not on file  Other Topics Concern  . Not on file  Social History Narrative  . Not on file   Social Determinants of Health   Financial Resource Strain:   . Difficulty of Paying Living Expenses:   Food Insecurity:   . Worried About Programme researcher, broadcasting/film/video in the Last Year:   . [employer] of Food in the Last Year:  Transportation Needs:   . Freight forwarder (Medical):   Marland Kitchen Lack of Transportation (Non-Medical):   Physical Activity:   . Days of Exercise per Week:   . Minutes of Exercise per Session:   Stress:   . Feeling of Stress :   Social Connections:   . Frequency of Communication with Friends and Family:   . Frequency of Social Gatherings with Friends and Family:   . Attends Religious Services:   . Active Member of Clubs or Organizations:   . Attends Banker Meetings:   Marland Kitchen Marital Status:      Family History:  The patient's family history includes CAD in his brother, father, and sister; Diabetes Mellitus II in his brother and sister.   ROS:   Please  see the history of present illness.    ROS All other systems reviewed and are negative.   PHYSICAL EXAM:   VS:  BP 130/80   Pulse 60   Ht 6' (1.829 m)   Wt 186 lb 6.4 oz (84.6 kg)   SpO2 98%   BMI 25.28 kg/m    GEN: Well nourished, well developed, in no acute distress  HEENT: normal  Neck: no JVD, carotid bruits, or masses Cardiac: RRR; no murmurs, rubs, or gallops 2+ left lower extremity edema and trace right lower extremity edema Respiratory:  clear to auscultation bilaterally, normal work of breathing GI: soft, nontender, nondistended, + BS MS: no deformity or atrophy  Skin: warm and dry, no rash Neuro:  Alert and Oriented x 3, Strength and sensation are intact Psych: euthymic mood, full affect  Wt Readings from Last 3 Encounters:  02/29/20 186 lb 6.4 oz (84.6 kg)  06/06/19 176 lb (79.8 kg)  12/07/17 184 lb 6.4 oz (83.6 kg)      Studies/Labs Reviewed:   EKG:  EKG is ordered today.  The ekg ordered today demonstrates atrial fibrillation at rate of 60 bpm  Recent Labs: No results found for requested labs within last 8760 hours.   Lipid Panel    Component Value Date/Time   CHOL 293 (H) 01/26/2017 0800   TRIG 174 (H) 01/26/2017 0800   HDL 33 (L) 01/26/2017 0800   CHOLHDL 8.9 (H) 01/26/2017 0800   CHOLHDL 4.0 07/10/2016 0825   VLDL 25 07/10/2016 0825   LDLCALC 225 (H) 01/26/2017 0800    Additional studies/ records that were reviewed today include:   Carotid Doopler 07/2018 Summary:  Right Carotid: Velocities in the right ICA are consistent with a 1-39%  stenosis.   Left Carotid: Velocities in the left ICA are consistent with a 1-39%  stenosis.    ASSESSMENT & PLAN:    1. Permanent atrial fibrillation Rate controlled on beta-blocker.  On warfarin for anticoagulation without bleeding problems.  2.  Hyperlipidemia -History of statin intolerance.  Previously declined PCSK9 inhibitor.  3.  Hypertension -Blood pressure stable and well-controlled on  current medication  4.   lower extremity edema -Noted 2+ left lower extremity edema and trace right lower extremity edema -Patient reports having lower extremity edema since he injured his feet on left side last year -He has no symptoms concerning for CHF (no orthopnea, PND or shortness of breath) -He is compliant with his Coumadin so chance for DVT is less -Undergoing evaluation of left iliac node enlargement>>?  Underlying malignancy>> patient to complete his work-up -Discussed as needed Lasix or echocardiogram however patient prefers to defer until malignancy work-up>> he will follow-up with PCP  5.  Surgical  clearance -Patient is easily getting greater than 4 METS of activity. Given past medical history and time since last visit, based on ACC/AHA guidelines, Francisco Olsen would be at acceptable risk for the planned procedure without further cardiovascular testing.   Per pharmacy "CHADS2-VASc score of  4 (HTN, AGE, DM2, AGE)  CrCl 87 ml/min  Per office protocol, patient can hold warfarin for 5 days prior to procedure.    Patient will NOT need bridging with Lovenox (enoxaparin) around procedure".   I will route this recommendation to the requesting party via Epic fax function and remove from pre-op pool.  Please call with questions.  [place], [place] 02/29/2020, 11:45 AM       Medication Adjustments/Labs and Tests Ordered: Current medicines are reviewed at length with the patient today.  Concerns regarding medicines are outlined above.  Medication changes, Labs and Tests ordered today are listed in the Patient Instructions below. Patient Instructions  Medication Instructions:  Your physician recommends that you continue on your current medications as directed. Please refer to the Current Medication list given to you today.  *If you need a refill on your cardiac medications before your next appointment, please call your pharmacy*   Lab Work: None ordered  If you  have labs (blood work) drawn today and your tests are completely normal, you will receive your results only by: Marland Kitchen MyChart Message (if you have MyChart) OR . A paper copy in the mail If you have any lab test that is abnormal or we need to change your treatment, we will call you to review the results.   Testing/Procedures: None ordered   Follow-Up: At [facility], you and your health needs are our priority.  As part of our continuing mission to provide you with exceptional heart care, we have created designated Provider Care Teams.  These Care Teams include your primary Cardiologist (physician) and Advanced Practice Providers (APPs -  Physician Assistants and Nurse Practitioners) who all work together to provide you with the care you need, when you need it.  We recommend signing up for the patient portal called "MyChart".  Sign up information is provided on this After Visit Summary.  MyChart is used to connect with patients for Virtual Visits (Telemedicine).  Patients are able to view lab/test results, encounter notes, upcoming appointments, etc.  Non-urgent messages can be sent to your provider as well.   To learn more about what you can do with MyChart, go to [email or web address].    Your next appointment:   6 month(s)  The format for your next appointment:   In Person  Provider:   You may see Fransico Him, MD or one of the following Advanced Practice Providers on your designated Care Team:    Melina Copa, PA-C  Ermalinda Barrios, PA-C    Other Instructions      Signed, Leanor Kail, [place]  02/29/2020 11:40 AM    [address], [place], [place]  [address] Phone: [phone]; Fax: [phone]

## 2020-02-29 NOTE — Patient Instructions (Addendum)
Medication Instructions:  Your physician recommends that you continue on your current medications as directed. Please refer to the Current Medication list given to you today.  *If you need a refill on your cardiac medications before your next appointment, please call your pharmacy*   Lab Work: None ordered  If you have labs (blood work) drawn today and your tests are completely normal, you will receive your results only by: . MyChart Message (if you have MyChart) OR . A paper copy in the mail If you have any lab test that is abnormal or we need to change your treatment, we will call you to review the results.   Testing/Procedures: None ordered   Follow-Up: At [facility], you and your health needs are our priority.  As part of our continuing mission to provide you with exceptional heart care, we have created designated Provider Care Teams.  These Care Teams include your primary Cardiologist (physician) and Advanced Practice Providers (APPs -  Physician Assistants and Nurse Practitioners) who all work together to provide you with the care you need, when you need it.  We recommend signing up for the patient portal called "MyChart".  Sign up information is provided on this After Visit Summary.  MyChart is used to connect with patients for Virtual Visits (Telemedicine).  Patients are able to view lab/test results, encounter notes, upcoming appointments, etc.  Non-urgent messages can be sent to your provider as well.   To learn more about what you can do with MyChart, go to [email or web address].    Your next appointment:   6 month(s)  The format for your next appointment:   In Person  Provider:   You may see Traci Turner, MD or one of the following Advanced Practice Providers on your designated Care Team:    Dayna Dunn, PA-C  Michele Lenze, PA-C    Other Instructions   

## 2020-03-14 ENCOUNTER — Encounter (HOSPITAL_BASED_OUTPATIENT_CLINIC_OR_DEPARTMENT_OTHER): Payer: Self-pay | Admitting: Surgery

## 2020-03-14 ENCOUNTER — Other Ambulatory Visit: Payer: Self-pay

## 2020-03-15 ENCOUNTER — Other Ambulatory Visit: Payer: Self-pay | Admitting: Surgery

## 2020-03-16 ENCOUNTER — Other Ambulatory Visit (HOSPITAL_COMMUNITY)
Admission: RE | Admit: 2020-03-16 | Discharge: 2020-03-16 | Disposition: A | Discharge status: Home / Self Care | Payer: Medicare Other | Source: Ambulatory Visit | Attending: Surgery | Admitting: Surgery

## 2020-03-16 DIAGNOSIS — Z20822 Contact with and (suspected) exposure to covid-19: Secondary | ICD-10-CM | POA: Diagnosis not present

## 2020-03-16 DIAGNOSIS — Z01812 Encounter for preprocedural laboratory examination: Secondary | ICD-10-CM | POA: Diagnosis present

## 2020-03-17 LAB — SARS CORONAVIRUS 2 (TAT 6-24 HRS): SARS Coronavirus 2: NEGATIVE

## 2020-03-19 NOTE — H&P (Signed)
[name] Appointment: 02/27/2020 11:30 AM Location: [facility] Patient #: [id number] DOB: 04-16-1941 Married / Language: Francisco Olsen / Race: White Male   History of Present Illness Francisco Olsen A. [name] MD; 02/27/2020 11:58 AM) The patient is a 79 year old male who presents with lymphadenopathy.  Chief complaint: Lymphadenopathy  This is a 79 year old gentleman accompanied by his wife. He has been referred to me by Dr. Brigitte Pulse for evaluation of inguinal lymphadenopathy. He is also seen urology but I do not have their notes. He is a fairly poor historian regarding his health. He had a CT scan of his abdomen and pelvis in November of last year for hematuria. He had an enlarged left external iliac lymph node at that time of uncertain etiology. He had a follow-up CT scan in February of this year showing lymph node enlargement in the left inguinal chain and around the external iliac. As the lymph nodes were mildly increased, metastatic disease cannot be completely ruled out. Currently, he has no inguinal discomfort. He reports no fevers or night sweats. He is even uncertain what the urologist recommended.    Past Surgical History Francisco Olsen, Francisco Olsen; 02/27/2020 11:19 AM) Gallbladder Surgery - Laparoscopic  Spinal Surgery - Lower Back   Diagnostic Studies History (Francisco Olsen, CMA; 02/27/2020 11:19 AM) Colonoscopy  1-5 years ago  Allergies ([name], CMA; 02/27/2020 11:20 AM) Plavix *HEMATOLOGICAL AGENTS - MISC.*  Allergies Reconciled   Medication History (Francisco Olsen, CMA; 02/27/2020 11:21 AM) Metoprolol Tartrate (50MG  Tablet, Oral) Active. amLODIPine Besylate (5MG  Tablet, Oral) Active. Warfarin Sodium (5MG  Tablet, Oral) Active. Fish Oil (1200MG  Capsule, Oral) Active. Omega 3-6-9 Complex (Oral) Specific strength unknown - Active. Medications Reconciled  Social History (Francisco Olsen, CMA; 02/27/2020 11:19 AM) Caffeine use  Carbonated beverages,  Tea.  Family History ([name], [name]; 02/27/2020 11:19 AM) Alcohol Abuse  Father. Arthritis  Son. Colon Cancer  Father. Heart Disease  Father. Prostate Cancer  Brother.  Other Problems (Francisco Olsen, CMA; 02/27/2020 11:19 AM) Atrial Fibrillation  Back Pain  Gastroesophageal Reflux Disease  Hypercholesterolemia     Review of Systems (Francisco Olsen CMA; 02/27/2020 11:19 AM) General Not Present- Appetite Loss, Chills, Fatigue, Fever, Night Sweats, Weight Gain and Weight Loss. Skin Not Present- Change in Wart/Mole, Dryness, Hives, Jaundice, New Lesions, Non-Healing Wounds, Rash and Ulcer. HEENT Present- Ringing in the Ears and Wears glasses/contact lenses. Not Present- Earache, Hearing Loss, Hoarseness, Nose Bleed, Oral Ulcers, Seasonal Allergies, Sinus Pain, Sore Throat, Visual Disturbances and Yellow Eyes. Respiratory Not Present- Bloody sputum, Chronic Cough, Difficulty Breathing, Snoring and Wheezing. Breast Not Present- Breast Mass, Breast Pain, Nipple Discharge and Skin Changes. Cardiovascular Not Present- Chest Pain, Difficulty Breathing Lying Down, Leg Cramps, Palpitations, Rapid Heart Rate, Shortness of Breath and Swelling of Extremities. Gastrointestinal Present- Change in Bowel Habits and Excessive gas. Not Present- Abdominal Pain, Bloating, Bloody Stool, Chronic diarrhea, Constipation, Difficulty Swallowing, Gets full quickly at meals, Hemorrhoids, Indigestion, Nausea, Rectal Pain and Vomiting. Male Genitourinary Present- Change in Urinary Stream, Frequency, Nocturia, Painful Urination and Urine Leakage. Not Present- Blood in Urine, Impotence and Urgency. Musculoskeletal Not Present- Back Pain, Joint Pain, Joint Stiffness, Muscle Pain, Muscle Weakness and Swelling of Extremities. Neurological Not Present- Decreased Memory, Fainting, Headaches, Numbness, Seizures, Tingling, Tremor, Trouble walking and Weakness. Psychiatric Not Present- Anxiety, Bipolar, Change in Sleep  Pattern, Depression, Fearful and Frequent crying. Endocrine Not Present- Cold Intolerance, Excessive Hunger, Hair Changes, Heat Intolerance, Hot flashes and New Diabetes. Hematology Not Present- Blood Thinners, Easy Bruising,  Excessive bleeding, Gland problems, HIV and Persistent Infections.  Vitals (Francisco Olsen CMA; 02/27/2020 11:21 AM) 02/27/2020 11:21 AM Weight: 185.38 lb Height: 72in Body Surface Area: 2.06 m Body Mass Index: 25.14 kg/m  Temp.: 97.44F  Pulse: 71 (Regular)        Physical Exam (Veva Grimley A. Magnus Ivan MD; 02/27/2020 11:59 AM) The physical exam findings are as follows: Note: On exam, he appears well.  There is shotty adenopathy in both inguinal areas with the left being larger than the right. These are nontender. There are no other antibiotics. There are no hernias.    Assessment & Plan (Saria Haran A. Magnus Ivan MD; 02/27/2020 12:01 PM) LYMPHADENOPATHY, INGUINAL (R59.0) Impression: I reviewed the patient's CAT scans. I am awaiting notes from the urologist. He does have enlarging inguinal lymphadenopathy. This can be worrisome for metastatic disease. He has no constitutional symptoms like a lymphoma. I discussed the diagnosis with the patient and his wife. A left inguinal lymph node incisional biopsy has been recommended for complete histologic evaluation to rule out malignancy. I discussed the procedure with him in detail. I discussed the risk which includes but is not limited to bleeding, infection, injury to surrounding structures, seroma formation, the biopsy being nondiagnostic, the need for further procedures, etc. They understand and wish to proceed with surgery which will be scheduled.

## 2020-03-20 ENCOUNTER — Encounter (HOSPITAL_BASED_OUTPATIENT_CLINIC_OR_DEPARTMENT_OTHER)
Admission: RE | Disposition: A | Discharge status: Home / Self Care | Payer: Self-pay | Source: Ambulatory Visit | Attending: Surgery

## 2020-03-20 ENCOUNTER — Encounter (HOSPITAL_BASED_OUTPATIENT_CLINIC_OR_DEPARTMENT_OTHER): Payer: Self-pay | Admitting: Surgery

## 2020-03-20 ENCOUNTER — Ambulatory Visit (HOSPITAL_BASED_OUTPATIENT_CLINIC_OR_DEPARTMENT_OTHER): Payer: Medicare Other | Admitting: Certified Registered"

## 2020-03-20 ENCOUNTER — Other Ambulatory Visit: Payer: Self-pay

## 2020-03-20 ENCOUNTER — Ambulatory Visit (HOSPITAL_BASED_OUTPATIENT_CLINIC_OR_DEPARTMENT_OTHER)
Admission: RE | Admit: 2020-03-20 | Discharge: 2020-03-20 | Disposition: A | Discharge status: Home / Self Care | Payer: Medicare Other | Source: Ambulatory Visit | Attending: Surgery | Admitting: Surgery

## 2020-03-20 ENCOUNTER — Other Ambulatory Visit: Payer: Self-pay | Admitting: Cardiology

## 2020-03-20 DIAGNOSIS — Z8042 Family history of malignant neoplasm of prostate: Secondary | ICD-10-CM | POA: Diagnosis not present

## 2020-03-20 DIAGNOSIS — Z888 Allergy status to other drugs, medicaments and biological substances status: Secondary | ICD-10-CM | POA: Diagnosis not present

## 2020-03-20 DIAGNOSIS — E7800 Pure hypercholesterolemia, unspecified: Secondary | ICD-10-CM | POA: Insufficient documentation

## 2020-03-20 DIAGNOSIS — E119 Type 2 diabetes mellitus without complications: Secondary | ICD-10-CM | POA: Diagnosis not present

## 2020-03-20 DIAGNOSIS — Z800 Family history of malignant neoplasm of digestive organs: Secondary | ICD-10-CM | POA: Diagnosis not present

## 2020-03-20 DIAGNOSIS — I10 Essential (primary) hypertension: Secondary | ICD-10-CM | POA: Insufficient documentation

## 2020-03-20 DIAGNOSIS — R590 Localized enlarged lymph nodes: Secondary | ICD-10-CM | POA: Insufficient documentation

## 2020-03-20 DIAGNOSIS — I4891 Unspecified atrial fibrillation: Secondary | ICD-10-CM | POA: Insufficient documentation

## 2020-03-20 HISTORY — PX: INGUINAL LYMPH NODE BIOPSY: SHX5865

## 2020-03-20 LAB — GLUCOSE, CAPILLARY: Glucose-Capillary: 95 mg/dL (ref 70–99)

## 2020-03-20 LAB — PROTIME-INR
INR: 1.2 (ref 0.8–1.2)
Prothrombin Time: 15 seconds (ref 11.4–15.2)

## 2020-03-20 SURGERY — BIOPSY, LYMPH NODE, INGUINAL, OPEN
Anesthesia: General | Site: Groin | Laterality: Left

## 2020-03-20 MED ORDER — PROPOFOL 10 MG/ML IV BOLUS
INTRAVENOUS | Status: DC | PRN
  Administered 2020-03-20: 150 mg via INTRAVENOUS

## 2020-03-20 MED ORDER — CHLORHEXIDINE GLUCONATE CLOTH 2 % EX PADS: 6.0000 | MEDICATED_PAD | Freq: Once | CUTANEOUS | Status: DC

## 2020-03-20 MED ORDER — CEFAZOLIN SODIUM-DEXTROSE 2-4 GM/100ML-% IV SOLN
2.0000 g | INTRAVENOUS | Status: AC
  Administered 2020-03-20: 2 g via INTRAVENOUS

## 2020-03-20 MED ORDER — ALBUTEROL SULFATE HFA 108 (90 BASE) MCG/ACT IN AERS
INHALATION_SPRAY | RESPIRATORY_TRACT | Status: AC
  Filled 2020-03-20: qty 6.7

## 2020-03-20 MED ORDER — FENTANYL CITRATE (PF) 100 MCG/2ML IJ SOLN
INTRAMUSCULAR | Status: AC
  Filled 2020-03-20: qty 2

## 2020-03-20 MED ORDER — PROPOFOL 10 MG/ML IV BOLUS
INTRAVENOUS | Status: AC
  Filled 2020-03-20: qty 20

## 2020-03-20 MED ORDER — PROMETHAZINE HCL 25 MG/ML IJ SOLN: 6.2500 mg | INTRAMUSCULAR | Status: DC | PRN

## 2020-03-20 MED ORDER — HYDROCODONE-ACETAMINOPHEN 7.5-325 MG PO TABS
1.0000 | ORAL_TABLET | Freq: Once | ORAL | Status: DC | PRN
Start: 2020-03-20 — End: 2020-03-20

## 2020-03-20 MED ORDER — ENSURE PRE-SURGERY PO LIQD: 296.0000 mL | Freq: Once | ORAL | Status: DC

## 2020-03-20 MED ORDER — LACTATED RINGERS IV SOLN: INTRAVENOUS | Status: DC

## 2020-03-20 MED ORDER — FENTANYL CITRATE (PF) 100 MCG/2ML IJ SOLN
INTRAMUSCULAR | Status: DC | PRN
  Administered 2020-03-20: 25 ug via INTRAVENOUS

## 2020-03-20 MED ORDER — MIDAZOLAM HCL 2 MG/2ML IJ SOLN: 1.0000 mg | INTRAMUSCULAR | Status: DC | PRN

## 2020-03-20 MED ORDER — GABAPENTIN 300 MG PO CAPS
300.0000 mg | ORAL_CAPSULE | ORAL | Status: AC
  Administered 2020-03-20: 300 mg via ORAL

## 2020-03-20 MED ORDER — MEPERIDINE HCL 25 MG/ML IJ SOLN: 6.2500 mg | INTRAMUSCULAR | Status: DC | PRN

## 2020-03-20 MED ORDER — CEFAZOLIN SODIUM-DEXTROSE 2-4 GM/100ML-% IV SOLN
INTRAVENOUS | Status: AC
  Filled 2020-03-20: qty 100

## 2020-03-20 MED ORDER — EPHEDRINE SULFATE 50 MG/ML IJ SOLN
INTRAMUSCULAR | Status: DC | PRN
  Administered 2020-03-20: 5 mg via INTRAVENOUS
  Administered 2020-03-20: 10 mg via INTRAVENOUS

## 2020-03-20 MED ORDER — HYDROMORPHONE HCL 1 MG/ML IJ SOLN: 0.2500 mg | INTRAMUSCULAR | Status: DC | PRN

## 2020-03-20 MED ORDER — GABAPENTIN 300 MG PO CAPS
ORAL_CAPSULE | ORAL | Status: AC
  Filled 2020-03-20: qty 1

## 2020-03-20 MED ORDER — DEXAMETHASONE SODIUM PHOSPHATE 10 MG/ML IJ SOLN
INTRAMUSCULAR | Status: DC | PRN
  Administered 2020-03-20: 5 mg via INTRAVENOUS

## 2020-03-20 MED ORDER — ONDANSETRON HCL 4 MG/2ML IJ SOLN
INTRAMUSCULAR | Status: DC | PRN
  Administered 2020-03-20: 4 mg via INTRAVENOUS

## 2020-03-20 MED ORDER — ACETAMINOPHEN 500 MG PO TABS
1000.0000 mg | ORAL_TABLET | ORAL | Status: AC
  Administered 2020-03-20: 1000 mg via ORAL

## 2020-03-20 MED ORDER — ACETAMINOPHEN 500 MG PO TABS
ORAL_TABLET | ORAL | Status: AC
  Filled 2020-03-20: qty 2

## 2020-03-20 MED ORDER — FENTANYL CITRATE (PF) 100 MCG/2ML IJ SOLN: 50.0000 ug | INTRAMUSCULAR | Status: DC | PRN

## 2020-03-20 MED ORDER — BUPIVACAINE HCL (PF) 0.5 % IJ SOLN
INTRAMUSCULAR | Status: DC | PRN
  Administered 2020-03-20: 10 mL

## 2020-03-20 MED ORDER — TRAMADOL HCL 50 MG PO TABS: 50.0000 mg | ORAL_TABLET | Freq: Four times a day (QID) | ORAL | 0 refills | Status: DC | PRN

## 2020-03-20 MED ORDER — LIDOCAINE HCL (CARDIAC) PF 100 MG/5ML IV SOSY
PREFILLED_SYRINGE | INTRAVENOUS | Status: DC | PRN
  Administered 2020-03-20: 60 mg via INTRAVENOUS

## 2020-03-20 SURGICAL SUPPLY — 48 items
ADH SKN CLS APL DERMABOND .7 (GAUZE/BANDAGES/DRESSINGS) ×1
APL PRP STRL LF DISP 70% ISPRP (MISCELLANEOUS) ×1
APPLIER CLIP 9.375 MED OPEN (MISCELLANEOUS) ×3
APR CLP MED 9.3 20 MLT OPN (MISCELLANEOUS) ×1
BLADE SURG 15 STRL LF DISP TIS (BLADE) ×1 IMPLANT
BLADE SURG 15 STRL SS (BLADE) ×3
CANISTER SUCT 1200ML W/VALVE (MISCELLANEOUS) ×3 IMPLANT
CHLORAPREP W/TINT 26 (MISCELLANEOUS) ×3 IMPLANT
CLIP APPLIE 9.375 MED OPEN (MISCELLANEOUS) ×1 IMPLANT
COVER BACK TABLE 60X90IN (DRAPES) ×3 IMPLANT
COVER MAYO STAND STRL (DRAPES) ×3 IMPLANT
DERMABOND ADVANCED (GAUZE/BANDAGES/DRESSINGS) ×2
DERMABOND ADVANCED .7 DNX12 (GAUZE/BANDAGES/DRESSINGS) ×2 IMPLANT
DRAIN CHANNEL 19F RND (DRAIN) ×1 IMPLANT
DRAIN PENROSE 1/4X12 LTX STRL (WOUND CARE) IMPLANT
DRAPE LAPAROSCOPIC ABDOMINAL (DRAPES) ×2 IMPLANT
DRAPE UTILITY XL STRL (DRAPES) ×3 IMPLANT
ELECT REM PT RETURN 9FT ADLT (ELECTROSURGICAL) ×3
ELECTRODE REM PT RTRN 9FT ADLT (ELECTROSURGICAL) ×1 IMPLANT
EVACUATOR SILICONE 100CC (DRAIN) ×1 IMPLANT
GLOVE BIO SURGEON STRL SZ 6.5 (GLOVE) ×2 IMPLANT
GLOVE BIO SURGEONS STRL SZ 6.5 (GLOVE) ×2
GLOVE BIOGEL PI IND STRL 6.5 (GLOVE) IMPLANT
GLOVE BIOGEL PI INDICATOR 6.5 (GLOVE) ×2
GLOVE SURG SIGNA 7.5 PF LTX (GLOVE) ×3 IMPLANT
GOWN STRL REUS W/ TWL LRG LVL3 (GOWN DISPOSABLE) ×1 IMPLANT
GOWN STRL REUS W/ TWL XL LVL3 (GOWN DISPOSABLE) ×1 IMPLANT
GOWN STRL REUS W/TWL LRG LVL3 (GOWN DISPOSABLE) ×3
GOWN STRL REUS W/TWL XL LVL3 (GOWN DISPOSABLE) ×3
NDL HYPO 25X1 1.5 SAFETY (NEEDLE) ×1 IMPLANT
NEEDLE HYPO 25X1 1.5 SAFETY (NEEDLE) ×3 IMPLANT
NS IRRIG 1000ML POUR BTL (IV SOLUTION) ×3 IMPLANT
PENCIL SMOKE EVACUATOR (MISCELLANEOUS) ×3 IMPLANT
PIN SAFETY STERILE (MISCELLANEOUS) ×1 IMPLANT
SET BASIN DAY SURGERY F.S. (CUSTOM PROCEDURE TRAY) ×3 IMPLANT
SLEEVE SCD COMPRESS KNEE MED (MISCELLANEOUS) ×3 IMPLANT
SPONGE LAP 4X18 RFD (DISPOSABLE) ×6 IMPLANT
STAPLER VISISTAT 35W (STAPLE) IMPLANT
SUT ETHILON 2 0 FS 18 (SUTURE) ×1 IMPLANT
SUT MNCRL AB 4-0 PS2 18 (SUTURE) ×3 IMPLANT
SUT VIC AB 3-0 SH 27 (SUTURE) ×3
SUT VIC AB 3-0 SH 27X BRD (SUTURE) ×1 IMPLANT
SYR BULB EAR ULCER 3OZ GRN STR (SYRINGE) IMPLANT
SYR CONTROL 10ML LL (SYRINGE) ×3 IMPLANT
TOWEL GREEN STERILE FF (TOWEL DISPOSABLE) ×3 IMPLANT
TUBE CONNECTING 20'X1/4 (TUBING) ×1
TUBE CONNECTING 20X1/4 (TUBING) ×2 IMPLANT
YANKAUER SUCT BULB TIP NO VENT (SUCTIONS) ×3 IMPLANT

## 2020-03-20 NOTE — Anesthesia Preprocedure Evaluation (Addendum)
Anesthesia Evaluation  Patient identified by MRN, date of birth, ID band Patient awake    Reviewed: Allergy & Precautions, NPO status , Patient's Chart, lab work & pertinent test results, reviewed documented beta blocker date and time   Airway Mallampati: II  TM Distance: >3 FB Neck ROM: Full    Dental no notable dental hx.    Pulmonary neg pulmonary ROS,    Pulmonary exam normal breath sounds clear to auscultation       Cardiovascular hypertension, Pt. on medications and Pt. on home beta blockers Normal cardiovascular exam+ dysrhythmias Atrial Fibrillation  Rhythm:Regular Rate:Normal     Neuro/Psych negative neurological ROS  negative psych ROS   GI/Hepatic Neg liver ROS, GERD  ,  Endo/Other  diabetes  Renal/GU negative Renal ROS  negative genitourinary   Musculoskeletal negative musculoskeletal ROS (+)   Abdominal   Peds negative pediatric ROS (+)  Hematology negative hematology ROS (+)   Anesthesia Other Findings   Reproductive/Obstetrics negative OB ROS                            Anesthesia Physical Anesthesia Plan  ASA: III  Anesthesia Plan: General   Post-op Pain Management:    Induction: Intravenous  PONV Risk Score and Plan: 2 and Ondansetron, Midazolam and Treatment may vary due to age or medical condition  Airway Management Planned: LMA  Additional Equipment:   Intra-op Plan:   Post-operative Plan: Extubation in OR  Informed Consent: I have reviewed the patients History and Physical, chart, labs and discussed the procedure including the risks, benefits and alternatives for the proposed anesthesia with the patient or authorized representative who has indicated his/her understanding and acceptance.     Dental advisory given  Plan Discussed with: CRNA  Anesthesia Plan Comments:         Anesthesia Quick Evaluation

## 2020-03-20 NOTE — Discharge Instructions (Signed)
No vigorous activity for one week  Ice pack, tylenol, and ibuprofen also for pain  Ok to shower starting tomorrow   Call your surgeon if you experience:   1.  Fever over 101.0. 2.  Inability to urinate. 3.  Nausea and/or vomiting. 4.  Extreme swelling or bruising at the surgical site. 5.  Continued bleeding from the incision. 6.  Increased pain, redness or drainage from the incision. 7.  Problems related to your pain medication. 8.  Any problems and/or concerns   You received tylenol at 10:00 am and you may repeat tylenol anytime after 4:00 pm    Post Anesthesia Home Care Instructions  Activity: Get plenty of rest for the remainder of the day. A responsible individual must stay with you for 24 hours following the procedure.  For the next 24 hours, DO NOT: -Drive a car -Advertising copywriter -Drink alcoholic beverages -Take any medication unless instructed by your physician -Make any legal decisions or sign important papers.  Meals: Start with liquid foods such as gelatin or soup. Progress to regular foods as tolerated. Avoid greasy, spicy, heavy foods. If nausea and/or vomiting occur, drink only clear liquids until the nausea and/or vomiting subsides. Call your physician if vomiting continues.  Special Instructions/Symptoms: Your throat may feel dry or sore from the anesthesia or the breathing tube placed in your throat during surgery. If this causes discomfort, gargle with warm salt water. The discomfort should disappear within 24 hours.  If you had a scopolamine patch placed behind your ear for the management of post- operative nausea and/or vomiting:  1. The medication in the patch is effective for 72 hours, after which it should be removed.  Wrap patch in a tissue and discard in the trash. Wash hands thoroughly with soap and water. 2. You may remove the patch earlier than 72 hours if you experience unpleasant side effects which may include dry mouth, dizziness or visual  disturbances. 3. Avoid touching the patch. Wash your hands with soap and water after contact with the patch.

## 2020-03-20 NOTE — Transfer of Care (Signed)
Immediate Anesthesia Transfer of Care Note  Patient: Francisco Olsen  Procedure(s) Performed: LEFT INGUINAL LYMPH NODE BIOPSY (Left Groin)  Patient Location: PACU  Anesthesia Type:General  Level of Consciousness: awake, alert  and oriented  Airway & Oxygen Therapy: Patient Spontanous Breathing and Patient connected to face mask oxygen  Post-op Assessment: Report given to RN, Post -op Vital signs reviewed and stable and Patient moving all extremities X 4  Post vital signs: Reviewed and stable  Last Vitals:  Vitals Value Taken Time  BP 154/90 03/20/20 1136  Temp    Pulse 64 03/20/20 1139  Resp 23 03/20/20 1139  SpO2 100 % 03/20/20 1139  Vitals shown include unvalidated device data.  Last Pain:  Vitals:   03/20/20 0940  TempSrc: Oral  PainSc: 0-No pain         Complications: No apparent anesthesia complications

## 2020-03-20 NOTE — Anesthesia Procedure Notes (Signed)
Procedure Name: LMA Insertion Date/Time: 03/20/2020 11:05 AM Performed by: Lauralyn Primes, CRNA Pre-anesthesia Checklist: Patient identified, Emergency Drugs available, Suction available and Patient being monitored Patient Re-evaluated:Patient Re-evaluated prior to induction Oxygen Delivery Method: Circle system utilized Preoxygenation: Pre-oxygenation with 100% oxygen Induction Type: IV induction Ventilation: Mask ventilation without difficulty LMA: LMA inserted LMA Size: 5.0 Number of attempts: 1 Airway Equipment and Method: Bite block Placement Confirmation: positive ETCO2 Tube secured with: Tape Dental Injury: Teeth and Oropharynx as per pre-operative assessment

## 2020-03-20 NOTE — Op Note (Signed)
LEFT INGUINAL LYMPH NODE BIOPSY  Procedure Note  Erric Machnik 03/20/2020   Pre-op Diagnosis: LEFT INGUINAL LYMPHADENOPATHY     Post-op Diagnosis: same  Procedure(s): LEFT INGUINAL LYMPH NODE BIOPSY  Surgeon(s): [name], MD  Anesthesia: General  Staff:  Circulator: Maryan Rued, RN Scrub Person: Wardell Heath, CST  Estimated Blood Loss: Minimal               Specimens: sent to path  Indications: This is a 79 year old gentleman who has pelvic and inguinal lymphadenopathy.  An inguinal lymph node biopsy was requested by the primary care providers and urologist to rule out malignancy  Procedure: The patient was brought to the operating room and identifies correct patient.  He is placed upon the operating table and general anesthesia was induced.  His left inguinal area was prepped and draped in usual sterile fashion.  I anesthetized the skin with Marcaine.  I made incision with a scalpel.  I then dissected down through Scarpa's fascia into the inguinal area with electrocautery.  Identified two lymph nodes which I excised with the cautery and surgical clips.  These were sent to pathology for evaluation.  I could palpate deeper lymph nodes along the external iliac chain.  Hemostasis appeared to be achieved.  I then closed subcutaneous tissue with interrupted 3-0 Vicryl sutures and closed the skin with running 4-0 Monocryl.  Dermabond was then applied.  The patient tolerated the procedure well.  All the counts were correct at the end of the procedure.  The patient was then extubated in the operating room and taken in a stable addition to the recovery room.          [name]   Date: 03/20/2020  Time: 11:34 AM

## 2020-03-20 NOTE — Interval H&P Note (Signed)
History and Physical Interval Note:no change in H and P  03/20/2020 10:30 AM  Francisco Olsen  has presented today for surgery, with the diagnosis of LEFT INGUINAL LYMPHADENOPATHY.  The various methods of treatment have been discussed with the patient and family. After consideration of risks, benefits and other options for treatment, the patient has consented to  Procedure(s) with comments: LEFT INGUINAL LYMPH NODE BIOPSY (Left) - LMA as a surgical intervention.  The patient's history has been reviewed, patient examined, no change in status, stable for surgery.  I have reviewed the patient's chart and labs.  Questions were answered to the patient's satisfaction.     [name]

## 2020-03-21 ENCOUNTER — Encounter: Payer: Self-pay | Admitting: *Deleted

## 2020-03-21 LAB — SURGICAL PATHOLOGY

## 2020-03-21 NOTE — Anesthesia Postprocedure Evaluation (Signed)
Anesthesia Post Note  Patient: Francisco Olsen  Procedure(s) Performed: LEFT INGUINAL LYMPH NODE BIOPSY (Left Groin)     Patient location during evaluation: PACU Anesthesia Type: General Level of consciousness: awake and alert Pain management: pain level controlled Vital Signs Assessment: post-procedure vital signs reviewed and stable Respiratory status: spontaneous breathing, nonlabored ventilation and respiratory function stable Cardiovascular status: blood pressure returned to baseline and stable Postop Assessment: no apparent nausea or vomiting Anesthetic complications: no    Last Vitals:  Vitals:   03/20/20 1207 03/20/20 1220  BP: 137/69 (!) 148/84  Pulse: (!) 51 (!) 58  Resp: 13 16  Temp:  36.6 C  SpO2: 98% 98%    Last Pain:  Vitals:   03/20/20 1220  TempSrc:   PainSc: 0-No pain                 Lowella Curb

## 2020-03-23 ENCOUNTER — Other Ambulatory Visit: Payer: Self-pay | Admitting: Cardiology

## 2020-03-27 ENCOUNTER — Other Ambulatory Visit: Payer: Self-pay

## 2020-03-27 ENCOUNTER — Ambulatory Visit: Payer: Medicare Other

## 2020-03-27 DIAGNOSIS — I4821 Permanent atrial fibrillation: Secondary | ICD-10-CM

## 2020-03-27 DIAGNOSIS — Z5181 Encounter for therapeutic drug level monitoring: Secondary | ICD-10-CM

## 2020-03-27 LAB — POCT INR: INR: 1.4 — AB (ref 2.0–3.0)

## 2020-03-27 NOTE — Patient Instructions (Signed)
Description   Take 1.5 tablets today and tomorrow, then resume same dosage 1 tablet every day. Recheck in 10 days(usually 6 weeks). Call us with any concerns and medications changes # [phone].

## 2020-03-28 ENCOUNTER — Other Ambulatory Visit: Payer: Self-pay | Admitting: Cardiology

## 2020-04-06 ENCOUNTER — Ambulatory Visit: Payer: Medicare Other | Admitting: *Deleted

## 2020-04-06 ENCOUNTER — Other Ambulatory Visit: Payer: Self-pay

## 2020-04-06 DIAGNOSIS — I4821 Permanent atrial fibrillation: Secondary | ICD-10-CM | POA: Diagnosis not present

## 2020-04-06 DIAGNOSIS — Z5181 Encounter for therapeutic drug level monitoring: Secondary | ICD-10-CM | POA: Diagnosis not present

## 2020-04-06 LAB — POCT INR: INR: 2.3 (ref 2.0–3.0)

## 2020-04-06 NOTE — Patient Instructions (Signed)
Description   Continue same dosage 1 tablet every day. Recheck in 6 weeks.  Call us with any concerns and medications changes # [phone].     

## 2020-05-18 ENCOUNTER — Other Ambulatory Visit: Payer: Self-pay

## 2020-05-18 ENCOUNTER — Ambulatory Visit (INDEPENDENT_AMBULATORY_CARE_PROVIDER_SITE_OTHER): Payer: Medicare Other

## 2020-05-18 DIAGNOSIS — I4821 Permanent atrial fibrillation: Secondary | ICD-10-CM

## 2020-05-18 DIAGNOSIS — Z5181 Encounter for therapeutic drug level monitoring: Secondary | ICD-10-CM

## 2020-05-18 LAB — POCT INR: INR: 2.9 (ref 2.0–3.0)

## 2020-05-18 NOTE — Patient Instructions (Signed)
Continue same dosage 1 tablet every day. Recheck in 6 weeks.  Call us with any concerns and medications changes # [phone].

## 2020-06-23 ENCOUNTER — Other Ambulatory Visit: Payer: Self-pay | Admitting: Cardiology

## 2020-06-29 ENCOUNTER — Other Ambulatory Visit: Payer: Self-pay

## 2020-06-29 ENCOUNTER — Ambulatory Visit: Payer: Medicare Other | Admitting: *Deleted

## 2020-06-29 DIAGNOSIS — Z5181 Encounter for therapeutic drug level monitoring: Secondary | ICD-10-CM | POA: Diagnosis not present

## 2020-06-29 DIAGNOSIS — I4821 Permanent atrial fibrillation: Secondary | ICD-10-CM | POA: Diagnosis not present

## 2020-06-29 LAB — POCT INR: INR: 2.3 (ref 2.0–3.0)

## 2020-06-29 NOTE — Patient Instructions (Signed)
Description   Continue same dosage 1 tablet every day. Recheck in 6 weeks.  Call us with any concerns and medications changes # [phone].

## 2020-08-10 ENCOUNTER — Other Ambulatory Visit: Payer: Self-pay

## 2020-08-10 ENCOUNTER — Ambulatory Visit: Payer: Medicare Other | Admitting: Pharmacist

## 2020-08-10 DIAGNOSIS — I4821 Permanent atrial fibrillation: Secondary | ICD-10-CM | POA: Diagnosis not present

## 2020-08-10 DIAGNOSIS — Z5181 Encounter for therapeutic drug level monitoring: Secondary | ICD-10-CM

## 2020-08-10 LAB — POCT INR: INR: 2.9 (ref 2.0–3.0)

## 2020-08-10 NOTE — Patient Instructions (Signed)
Continue same dosage 1 tablet every day. Recheck in 6 weeks.  Call us with any concerns and medications changes # [phone].

## 2020-08-29 ENCOUNTER — Ambulatory Visit: Payer: Medicare Other | Admitting: Cardiology

## 2020-08-29 ENCOUNTER — Encounter: Payer: Self-pay | Admitting: Cardiology

## 2020-08-29 ENCOUNTER — Ambulatory Visit (INDEPENDENT_AMBULATORY_CARE_PROVIDER_SITE_OTHER): Payer: Medicare Other | Admitting: *Deleted

## 2020-08-29 ENCOUNTER — Other Ambulatory Visit: Payer: Self-pay

## 2020-08-29 VITALS — BP 120/58 | HR 65 | Ht 72.0 in | Wt 185.8 lb

## 2020-08-29 DIAGNOSIS — Z5181 Encounter for therapeutic drug level monitoring: Secondary | ICD-10-CM

## 2020-08-29 DIAGNOSIS — I4821 Permanent atrial fibrillation: Secondary | ICD-10-CM

## 2020-08-29 DIAGNOSIS — I6523 Occlusion and stenosis of bilateral carotid arteries: Secondary | ICD-10-CM | POA: Diagnosis not present

## 2020-08-29 DIAGNOSIS — I10 Essential (primary) hypertension: Secondary | ICD-10-CM

## 2020-08-29 DIAGNOSIS — E785 Hyperlipidemia, unspecified: Secondary | ICD-10-CM

## 2020-08-29 DIAGNOSIS — M79606 Pain in leg, unspecified: Secondary | ICD-10-CM

## 2020-08-29 LAB — POCT INR: INR: 2.8 (ref 2.0–3.0)

## 2020-08-29 MED ORDER — FUROSEMIDE 20 MG PO TABS
20.0000 mg | ORAL_TABLET | Freq: Every day | ORAL | 3 refills | Status: DC
Start: 2020-08-29 — End: 2022-09-16

## 2020-08-29 NOTE — Patient Instructions (Signed)
Description   Continue taking 1 tablet every day. Recheck in 7 weeks.  Call us with any concerns and medications changes # [phone].

## 2020-08-29 NOTE — Addendum Note (Signed)
Addended by: Theresia Majors on: 08/29/2020 11:51 AM   Modules accepted: Orders

## 2020-08-29 NOTE — Progress Notes (Signed)
Date:  08/29/2020   ID:  Rhonin Trott, DOB 14-May-1941, MRN [id number]   PCP:  [name], MD  Cardiologist:  Armanda Magic, MD Electrophysiologist:  None   Chief Complaint:  Atrial Fibrillation  History of Present Illness:    Francisco Olsen is a 79 y.o. male with a hx of chronic atrial fibrillation, HTN, dyslipidemia, GERD and carotid artery stenosis. He is here today for followup and is doing well.  he denies any chest pain or pressure, SOB, DOE, PND, orthopnea, dizziness, palpitations or syncope. He has chronic LE edema which is stable and comes and goes.  This has been present in the left foot since he had a chain saw injury a few years ago.  He is compliant with his meds and is tolerating meds with no SE.    Prior CV studies:   The following studies were reviewed today:  none  Past Medical History:  Diagnosis Date  . BPH (benign prostatic hyperplasia)   . Carotid artery stenosis    1-39% bilateral by dopplers 06/2016  . Chronic atrial fibrillation (HCC)    on chronic systemic anticoagulation with warfarin  . Diabetes mellitus without complication (HCC)   . GERD (gastroesophageal reflux disease)   . 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 Hyperlipidemia   . Hypertension    Past Surgical History:  Procedure Laterality Date  . cardioversion x 2    . CHOLECYSTECTOMY    . HERNIA REPAIR    . INGUINAL LYMPH NODE BIOPSY Left 03/20/2020   Procedure: LEFT INGUINAL LYMPH NODE BIOPSY;  Surgeon: [name], MD;  Location: [facility];  Service: General;  Laterality: Left;  . LAMINECTOMY    . SEPTOPLASTY    . TONSILECTOMY, ADENOIDECTOMY, BILATERAL MYRINGOTOMY AND TUBES       No outpatient medications have been marked as taking for the 08/29/20 encounter (Office Visit) with Quintella Reichert, MD.     Allergies:   Crestor [rosuvastatin calcium], Lipitor [atorvastatin], and Plavix [clopidogrel bisulfate]   Social History   Tobacco Use  . Smoking status: Never  Smoker  . Smokeless tobacco: Never Used  Vaping Use  . Vaping Use: Never used  Substance Use Topics  . Alcohol use: No  . Drug use: No     Family Hx: The patient's family history includes CAD in his brother, father, and sister; Diabetes Mellitus II in his brother and sister.  ROS:   Please see the history of present illness.     All other systems reviewed and are negative.   Labs/Other Tests and Data Reviewed:    Recent Labs: No results found for requested labs within last 8760 hours.   Recent Lipid Panel Lab Results  Component Value Date/Time   CHOL 293 (H) 01/26/2017 08:00 AM   TRIG 174 (H) 01/26/2017 08:00 AM   HDL 33 (L) 01/26/2017 08:00 AM   CHOLHDL 8.9 (H) 01/26/2017 08:00 AM   CHOLHDL 4.0 07/10/2016 08:25 AM   LDLCALC 225 (H) 01/26/2017 08:00 AM    Wt Readings from Last 3 Encounters:  08/29/20 185 lb 12.8 oz (84.3 kg)  03/20/20 179 lb 3.7 oz (81.3 kg)  02/29/20 186 lb 6.4 oz (84.6 kg)     Objective:    Vital Signs:  BP (!) 120/58   Pulse 65   Ht 6' (1.829 m)   Wt 185 lb 12.8 oz (84.3 kg)   BMI 25.20 kg/m    GEN: Well nourished, well developed in no acute distress  HEENT: Normal NECK: No JVD; No carotid bruits LYMPHATICS: No lymphadenopathy CARDIAC:irregularly irregular, no murmurs, rubs, gallops RESPIRATORY:  Clear to auscultation without rales, wheezing or rhonchi  ABDOMEN: Soft, non-tender, non-distended MUSCULOSKELETAL: mild edema; No deformity  SKIN: Warm and dry NEUROLOGIC:  Alert and oriented x 3 PSYCHIATRIC:  Normal affect   ASSESSMENT & PLAN:    1.  Permanent atrial fibrillation  -he denies any palpitations -no bleeding on warfarin -continue BB and warfarin   -check CBC in 1 week at time of BMET  2.  Hypertension  -BP well controlled on exam today -continue Lopressor 50mg  BID and amlodipine 5mg  daily  3.  Bilateral carotid artery stenosis  - dopplers 07/2018 showed stable bilateral carotid artery stenosis 1-39%.   -He is not on  ASA due to warfarin.  -repeat carotid dopplers  4.  Hyperlipidemia  - his last LDL 06/2020 was markedly elevated at 212 and he has been adamant in the past he did not want it treated.  -He has refused statin therapy in the past after seeing the lipid clinic on multiple occasions.   -He is now on Pravastatin 10mg  3 times weekly -followed by Dr. 08/2018  5.  LE pain -he is having pain and legs feel tight -unclear if this is related to edema or possibly the statin -he has good pulses in LEs -check ABIs in LE's to rule out PAD given that lipids have not been at goal -he does have some mild LE edema on exam so will add Lasix 20mg  daily -check BMET in 1 week  Medication Adjustments/Labs and Tests Ordered: Current medicines are reviewed at length with the patient today.  Concerns regarding medicines are outlined above.  Tests Ordered: No orders of the defined types were placed in this encounter.  Medication Changes: No orders of the defined types were placed in this encounter.   Disposition:  Follow up in 1 year(s)  Signed, 07/2020, MD  08/29/2020 11:43 AM    [facility]

## 2020-08-29 NOTE — Patient Instructions (Signed)
Medication Instructions:  Your physician has recommended you make the following change in your medication:  1) START taking Lasix (furosemide) 20 mg daily  *If you need a refill on your cardiac medications before your next appointment, please call your pharmacy*   Lab Work: BMET and CBC in one week  If you have labs (blood work) drawn today and your tests are completely normal, you will receive your results only by:  MyChart Message (if you have MyChart) OR  A paper copy in the mail If you have any lab test that is abnormal or we need to change your treatment, we will call you to review the results.   Testing/Procedures: Your physician has requested that you have a carotid duplex. This test is an ultrasound of the carotid arteries in your neck. It looks at blood flow through these arteries that supply the brain with blood. Allow one hour for this exam. There are no restrictions or special instructions.  Your physician has requested that you have an ankle brachial index (ABI). During this test an ultrasound and blood pressure cuff are used to evaluate the arteries that supply the arms and legs with blood. Allow thirty minutes for this exam. There are no restrictions or special instructions.  Follow-Up: At [facility], you and your health needs are our priority.  As part of our continuing mission to provide you with exceptional heart care, we have created designated Provider Care Teams.  These Care Teams include your primary Cardiologist (physician) and Advanced Practice Providers (APPs -  Physician Assistants and Nurse Practitioners) who all work together to provide you with the care you need, when you need it.  Your next appointment:   1 year(s)  The format for your next appointment:   In Person  Provider:   You may see Armanda Magic, MD or one of the following Advanced Practice Providers on your designated Care Team:    Ronie Spies, PA-C  Jacolyn Reedy, PA-C

## 2020-09-06 ENCOUNTER — Other Ambulatory Visit: Payer: Medicare Other | Admitting: *Deleted

## 2020-09-06 ENCOUNTER — Other Ambulatory Visit: Payer: Self-pay

## 2020-09-06 DIAGNOSIS — I10 Essential (primary) hypertension: Secondary | ICD-10-CM

## 2020-09-06 DIAGNOSIS — I4821 Permanent atrial fibrillation: Secondary | ICD-10-CM

## 2020-09-06 LAB — BASIC METABOLIC PANEL
BUN/Creatinine Ratio: 22 (ref 10–24)
BUN: 16 mg/dL (ref 8–27)
CO2: 25 mmol/L (ref 20–29)
Calcium: 9.4 mg/dL (ref 8.6–10.2)
Chloride: 102 mmol/L (ref 96–106)
Creatinine, Ser: 0.73 mg/dL — ABNORMAL LOW (ref 0.76–1.27)
GFR calc Af Amer: 103 mL/min/{1.73_m2} (ref >59)
GFR calc non Af Amer: 89 mL/min/{1.73_m2} (ref >59)
Glucose: 104 mg/dL — ABNORMAL HIGH (ref 65–99)
Potassium: 3.7 mmol/L (ref 3.5–5.2)
Sodium: 141 mmol/L (ref 134–144)

## 2020-09-06 LAB — CBC
Hematocrit: 39.9 % (ref 37.5–51.0)
Hemoglobin: 13.1 g/dL (ref 13.0–17.7)
MCH: 27.4 pg (ref 26.6–33.0)
MCHC: 32.8 g/dL (ref 31.5–35.7)
MCV: 84 fL (ref 79–97)
Platelets: 173 10*3/uL (ref 150–450)
RBC: 4.78 x10E6/uL (ref 4.14–5.80)
RDW: 13.7 % (ref 11.6–15.4)
WBC: 7.6 10*3/uL (ref 3.4–10.8)

## 2020-09-07 ENCOUNTER — Ambulatory Visit (HOSPITAL_BASED_OUTPATIENT_CLINIC_OR_DEPARTMENT_OTHER)
Admission: RE | Admit: 2020-09-07 | Discharge: 2020-09-07 | Disposition: A | Discharge status: Home / Self Care | Payer: Medicare Other | Source: Ambulatory Visit | Attending: Cardiology | Admitting: Cardiology

## 2020-09-07 ENCOUNTER — Ambulatory Visit (HOSPITAL_COMMUNITY)
Admission: RE | Admit: 2020-09-07 | Discharge: 2020-09-07 | Disposition: A | Discharge status: Home / Self Care | Payer: Medicare Other | Source: Ambulatory Visit | Attending: Cardiology | Admitting: Cardiology

## 2020-09-07 DIAGNOSIS — M79662 Pain in left lower leg: Secondary | ICD-10-CM

## 2020-09-07 DIAGNOSIS — M79661 Pain in right lower leg: Secondary | ICD-10-CM

## 2020-09-07 DIAGNOSIS — I6523 Occlusion and stenosis of bilateral carotid arteries: Secondary | ICD-10-CM | POA: Insufficient documentation

## 2020-09-07 DIAGNOSIS — M79606 Pain in leg, unspecified: Secondary | ICD-10-CM

## 2020-09-09 ENCOUNTER — Encounter: Payer: Self-pay | Admitting: Cardiology

## 2020-09-17 ENCOUNTER — Other Ambulatory Visit: Payer: Self-pay | Admitting: Cardiology

## 2020-10-16 ENCOUNTER — Other Ambulatory Visit: Payer: Self-pay

## 2020-10-16 ENCOUNTER — Ambulatory Visit: Payer: Medicare Other

## 2020-10-16 DIAGNOSIS — I4821 Permanent atrial fibrillation: Secondary | ICD-10-CM | POA: Diagnosis not present

## 2020-10-16 DIAGNOSIS — Z5181 Encounter for therapeutic drug level monitoring: Secondary | ICD-10-CM

## 2020-10-16 LAB — POCT INR: INR: 2.8 (ref 2.0–3.0)

## 2020-10-16 NOTE — Patient Instructions (Signed)
Description   Continue taking 1 tablet every day. Recheck in 7 weeks.  Call us with any concerns and medications changes # [phone].      

## 2020-12-04 ENCOUNTER — Ambulatory Visit (INDEPENDENT_AMBULATORY_CARE_PROVIDER_SITE_OTHER): Payer: Medicare Other

## 2020-12-04 ENCOUNTER — Other Ambulatory Visit: Payer: Self-pay

## 2020-12-04 DIAGNOSIS — Z5181 Encounter for therapeutic drug level monitoring: Secondary | ICD-10-CM

## 2020-12-04 DIAGNOSIS — I4821 Permanent atrial fibrillation: Secondary | ICD-10-CM

## 2020-12-04 LAB — POCT INR: INR: 2.5 (ref 2.0–3.0)

## 2020-12-04 NOTE — Patient Instructions (Signed)
Description   Continue taking 1 tablet every day. Recheck in 8 weeks.  Call us with any concerns and medications changes # [phone].

## 2021-01-29 ENCOUNTER — Ambulatory Visit (INDEPENDENT_AMBULATORY_CARE_PROVIDER_SITE_OTHER): Payer: Medicare Other | Admitting: Pharmacist

## 2021-01-29 ENCOUNTER — Other Ambulatory Visit: Payer: Self-pay

## 2021-01-29 DIAGNOSIS — Z5181 Encounter for therapeutic drug level monitoring: Secondary | ICD-10-CM | POA: Diagnosis not present

## 2021-01-29 DIAGNOSIS — I4821 Permanent atrial fibrillation: Secondary | ICD-10-CM | POA: Diagnosis not present

## 2021-01-29 LAB — POCT INR: INR: 2.4 (ref 2.0–3.0)

## 2021-01-29 NOTE — Patient Instructions (Signed)
Description   Continue taking 1 tablet every day. Recheck in 8 weeks.  Call us with any concerns and medications changes # [phone].

## 2021-03-09 ENCOUNTER — Other Ambulatory Visit: Payer: Self-pay | Admitting: Cardiology

## 2021-03-26 ENCOUNTER — Ambulatory Visit: Payer: Medicare Other | Admitting: *Deleted

## 2021-03-26 ENCOUNTER — Other Ambulatory Visit: Payer: Self-pay

## 2021-03-26 DIAGNOSIS — Z5181 Encounter for therapeutic drug level monitoring: Secondary | ICD-10-CM

## 2021-03-26 DIAGNOSIS — I4821 Permanent atrial fibrillation: Secondary | ICD-10-CM

## 2021-03-26 LAB — POCT INR: INR: 2.6 (ref 2.0–3.0)

## 2021-03-26 NOTE — Patient Instructions (Signed)
Description   Continue taking 1 tablet every day. Recheck in 8 weeks.  Call us with any concerns and medications changes # [phone].     

## 2021-05-21 ENCOUNTER — Ambulatory Visit: Payer: Medicare Other | Admitting: *Deleted

## 2021-05-21 ENCOUNTER — Other Ambulatory Visit: Payer: Self-pay

## 2021-05-21 DIAGNOSIS — I4821 Permanent atrial fibrillation: Secondary | ICD-10-CM

## 2021-05-21 DIAGNOSIS — Z5181 Encounter for therapeutic drug level monitoring: Secondary | ICD-10-CM

## 2021-05-21 LAB — POCT INR: INR: 2.5 (ref 2.0–3.0)

## 2021-05-21 NOTE — Patient Instructions (Signed)
Description   Continue taking 1 tablet every day. Recheck in 8 weeks.  Call us with any concerns and medications changes # [phone].

## 2021-06-01 ENCOUNTER — Other Ambulatory Visit: Payer: Self-pay | Admitting: Cardiology

## 2021-06-03 NOTE — Telephone Encounter (Signed)
Prescription refill request received for warfarin Lov: 09/08/20 Francisco Olsen) Next INR check: 07/16/21  Warfarin tablet strength: 5mg   Appropriate dose and refill sent to requested pharmacy.

## 2021-07-16 ENCOUNTER — Ambulatory Visit: Payer: Medicare Other | Admitting: *Deleted

## 2021-07-16 ENCOUNTER — Other Ambulatory Visit: Payer: Self-pay

## 2021-07-16 DIAGNOSIS — Z5181 Encounter for therapeutic drug level monitoring: Secondary | ICD-10-CM

## 2021-07-16 DIAGNOSIS — I4821 Permanent atrial fibrillation: Secondary | ICD-10-CM

## 2021-07-16 LAB — POCT INR: INR: 2.3 (ref 2.0–3.0)

## 2021-07-16 NOTE — Patient Instructions (Signed)
Description   Continue taking Warfarin 1 tablet every day. Recheck in 6 with with Dr. Mayford Knife Appt (normally 8 weeks). Call us with any concerns and medications changes # [phone].

## 2021-08-12 ENCOUNTER — Other Ambulatory Visit: Payer: Self-pay | Admitting: Cardiology

## 2021-08-29 ENCOUNTER — Ambulatory Visit (INDEPENDENT_AMBULATORY_CARE_PROVIDER_SITE_OTHER): Payer: Medicare Other | Admitting: *Deleted

## 2021-08-29 ENCOUNTER — Ambulatory Visit: Payer: Medicare Other | Admitting: Cardiology

## 2021-08-29 ENCOUNTER — Encounter: Payer: Self-pay | Admitting: Cardiology

## 2021-08-29 ENCOUNTER — Other Ambulatory Visit: Payer: Self-pay

## 2021-08-29 VITALS — BP 146/78 | HR 68 | Ht 72.0 in | Wt 187.0 lb

## 2021-08-29 DIAGNOSIS — R600 Localized edema: Secondary | ICD-10-CM

## 2021-08-29 DIAGNOSIS — I6523 Occlusion and stenosis of bilateral carotid arteries: Secondary | ICD-10-CM

## 2021-08-29 DIAGNOSIS — I10 Essential (primary) hypertension: Secondary | ICD-10-CM

## 2021-08-29 DIAGNOSIS — I4821 Permanent atrial fibrillation: Secondary | ICD-10-CM

## 2021-08-29 DIAGNOSIS — Z5181 Encounter for therapeutic drug level monitoring: Secondary | ICD-10-CM | POA: Diagnosis not present

## 2021-08-29 DIAGNOSIS — E785 Hyperlipidemia, unspecified: Secondary | ICD-10-CM | POA: Diagnosis not present

## 2021-08-29 DIAGNOSIS — M79606 Pain in leg, unspecified: Secondary | ICD-10-CM

## 2021-08-29 LAB — CBC
Hematocrit: 39.4 % (ref 37.5–51.0)
Hemoglobin: 13.2 g/dL (ref 13.0–17.7)
MCH: 28.1 pg (ref 26.6–33.0)
MCHC: 33.5 g/dL (ref 31.5–35.7)
MCV: 84 fL (ref 79–97)
Platelets: 176 10*3/uL (ref 150–450)
RBC: 4.69 x10E6/uL (ref 4.14–5.80)
RDW: 15.2 % (ref 11.6–15.4)
WBC: 7.3 10*3/uL (ref 3.4–10.8)

## 2021-08-29 LAB — POCT INR: INR: 2.8 (ref 2.0–3.0)

## 2021-08-29 MED ORDER — METOPROLOL TARTRATE 50 MG PO TABS: 50.0000 mg | ORAL_TABLET | Freq: Two times a day (BID) | ORAL | 3 refills | Status: DC

## 2021-08-29 MED ORDER — AMLODIPINE BESYLATE 5 MG PO TABS: 5.0000 mg | ORAL_TABLET | Freq: Every day | ORAL | 3 refills | Status: DC

## 2021-08-29 NOTE — Progress Notes (Signed)
Date:  08/29/2021   ID:  Francisco Olsen, DOB 1941-08-03, MRN [id number]   PCP:  [name], MD  Cardiologist:  Armanda Magic, MD Electrophysiologist:  None   Chief Complaint:  Atrial Fibrillation  History of Present Illness:    Francisco Olsen is a 80 y.o. male with a hx of chronic atrial fibrillation, HTN, dyslipidemia, GERD and carotid artery stenosis.   He is here today for followup and is doing well.  He denies any chest pain or pressure, SOB, DOE, PND, orthopnea, LE edema, dizziness, palpitations or syncope. He is compliant with his meds and is tolerating meds with no SE.     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 PLEASE KEEP UPCOMING APPT FOR MORE FUTURE REFILLS.   metoprolol tartrate (LOPRESSOR) 50 MG tablet TAKE 1 TABLET BY MOUTH TWICE A DAY   Omega-3 Fatty Acids (FISH OIL PO) Take 1,000 mg by mouth daily.    warfarin (COUMADIN) 5 MG tablet TAKE 1 TABLET BY MOUTH DAILY OR AS DIRECTED BY COUMADIN CLINIC     Allergies:    Crestor [rosuvastatin calcium], Lipitor [atorvastatin], and Plavix [clopidogrel bisulfate]   Social History   Tobacco Use   Smoking status: Never   Smokeless tobacco: Never  Vaping Use   Vaping Use: Never used  Substance Use Topics   Alcohol use: No   Drug use: No     Family Hx: The patient's family history includes CAD in his brother, father, and sister; Diabetes Mellitus II in his brother and sister.  ROS:   Please see the history of present illness.     All other systems reviewed and are negative.   Labs/Other Tests and Data Reviewed:    Recent Labs: 09/06/2020: BUN 16; Creatinine, Ser 0.73; Hemoglobin 13.1; Platelets 173; Potassium 3.7; Sodium 141   Recent Lipid Panel Lab Results  Component Value Date/Time   CHOL 293 (H) 01/26/2017 08:00 AM   TRIG 174 (H) 01/26/2017 08:00 AM   HDL 33 (L) 01/26/2017 08:00 AM   CHOLHDL 8.9 (H) 01/26/2017 08:00 AM   CHOLHDL 4.0 07/10/2016 08:25 AM   LDLCALC 225 (H) 01/26/2017 08:00 AM    Wt Readings from Last 3 Encounters:  08/29/21 187 lb (84.8 kg)  08/29/20 185 lb 12.8 oz (84.3 kg)  03/20/20 179 lb 3.7 oz (81.3 kg)     Objective:    Vital Signs:  BP (!) 146/78   Pulse  68   Ht 6' (1.829 m)   Wt 187 lb (84.8 kg)   SpO2 98%   BMI 25.36 kg/m    GEN: Well nourished, well developed in no acute distress HEENT: Normal NECK: No JVD; No carotid bruits LYMPHATICS: No lymphadenopathy CARDIAC:irregularly irregular, no murmurs, rubs, gallops RESPIRATORY:  Clear to auscultation without rales, wheezing or rhonchi  ABDOMEN: Soft, non-tender, non-distended MUSCULOSKELETAL:  No edema; No deformity  SKIN: Warm and dry NEUROLOGIC:  Alert and oriented x 3 PSYCHIATRIC:  Normal affect   ASSESSMENT & PLAN:    1.  Permanent atrial fibrillation  -His heart rate is well controlled today and he denies any palpitations -He has not had any bleeding problems on warfarin -Continue prescription drug management with Lopressor 50 mg twice daily  and warfarin and as needed refills  -Check hemoglobin today  2.  Hypertension  -His BP is adequately controlled on exam today -He will continue on prescription drug management with Lopressor 50 mg twice daily and amlodipine 5 mg daily with as needed refills   3.  Bilateral carotid artery stenosis  -dopplers 08/2020 showed stable bilateral carotid artery stenosis 1-39%.   -He is not on ASA due to warfarin.  -repeat carotid dopplers 08/2022  4.  Hyperlipidemia  - his last LDL 06/2020 was markedly elevated at 212 and he has been adamant in the past he did not want it treated.  -He has refused statin therapy in the past after seeing the lipid clinic on multiple occasions.   -I have personally reviewed and interpreted outside labs performed by patient's PCP which showed LDL 215, HDL 35, triglycerides 151, ALT 16 in October 2022 -Followed by PCP  5.  LE pain -Lower extremity ABIs a year ago were normal  6.  Lower extremity edema -this has resolved and has not needed any diuretics recently  Medication Adjustments/Labs and Tests Ordered: Current medicines are reviewed at length with the patient today.  Concerns regarding medicines are outlined above.  Tests Ordered: Orders Placed This Encounter  Procedures   EKG 12-Lead    Medication Changes: No orders of the defined types were placed in this encounter.   Disposition:  Follow up in 1 year(s)  Signed, Armanda Magic, MD  08/29/2021 9:07 AM    [facility]

## 2021-08-29 NOTE — Patient Instructions (Signed)
Medication Instructions:  Your physician recommends that you continue on your current medications as directed. Please refer to the Current Medication list given to you today.  *If you need a refill on your cardiac medications before your next appointment, please call your pharmacy*   Lab Work: TODAY: CBC If you have labs (blood work) drawn today and your tests are completely normal, you will receive your results only by: MyChart Message (if you have MyChart) OR A paper copy in the mail If you have any lab test that is abnormal or we need to change your treatment, we will call you to review the results.   Follow-Up: At [facility], you and your health needs are our priority.  As part of our continuing mission to provide you with exceptional heart care, we have created designated Provider Care Teams.  These Care Teams include your primary Cardiologist (physician) and Advanced Practice Providers (APPs -  Physician Assistants and Nurse Practitioners) who all work together to provide you with the care you need, when you need it.  Your next appointment:   1 year(s)  The format for your next appointment:   In Person  Provider:   Traci Turner, MD    

## 2021-08-29 NOTE — Patient Instructions (Signed)
Description   Continue taking Warfarin 1 tablet every day. Recheck in 8 weeks. Call us with any concerns and medications changes # [phone].      

## 2021-08-29 NOTE — Addendum Note (Signed)
Addended by: Theresia Majors on: 08/29/2021 09:15 AM   Modules accepted: Orders

## 2021-10-24 ENCOUNTER — Ambulatory Visit: Payer: Medicare Other | Admitting: *Deleted

## 2021-10-24 ENCOUNTER — Other Ambulatory Visit: Payer: Self-pay

## 2021-10-24 DIAGNOSIS — I4821 Permanent atrial fibrillation: Secondary | ICD-10-CM | POA: Diagnosis not present

## 2021-10-24 DIAGNOSIS — Z5181 Encounter for therapeutic drug level monitoring: Secondary | ICD-10-CM | POA: Diagnosis not present

## 2021-10-24 LAB — POCT INR: INR: 2.4 (ref 2.0–3.0)

## 2021-10-24 NOTE — Patient Instructions (Signed)
Description   Continue taking Warfarin 1 tablet every day. Recheck in 8 weeks. Call us with any concerns and medications changes # [phone].      

## 2021-12-19 ENCOUNTER — Other Ambulatory Visit: Payer: Self-pay

## 2021-12-19 ENCOUNTER — Ambulatory Visit: Payer: Medicare Other

## 2021-12-19 DIAGNOSIS — I4821 Permanent atrial fibrillation: Secondary | ICD-10-CM

## 2021-12-19 DIAGNOSIS — Z5181 Encounter for therapeutic drug level monitoring: Secondary | ICD-10-CM | POA: Diagnosis not present

## 2021-12-19 LAB — POCT INR: INR: 2.3 (ref 2.0–3.0)

## 2021-12-19 NOTE — Patient Instructions (Signed)
Description   Continue taking Warfarin 1 tablet every day. Recheck in 8 weeks. Call us with any concerns and medications changes # [phone].      

## 2021-12-23 ENCOUNTER — Other Ambulatory Visit: Payer: Self-pay | Admitting: Cardiology

## 2021-12-23 DIAGNOSIS — H2513 Age-related nuclear cataract, bilateral: Secondary | ICD-10-CM | POA: Diagnosis not present

## 2021-12-23 DIAGNOSIS — E113291 Type 2 diabetes mellitus with mild nonproliferative diabetic retinopathy without macular edema, right eye: Secondary | ICD-10-CM | POA: Diagnosis not present

## 2021-12-23 DIAGNOSIS — H524 Presbyopia: Secondary | ICD-10-CM | POA: Diagnosis not present

## 2021-12-23 DIAGNOSIS — H43813 Vitreous degeneration, bilateral: Secondary | ICD-10-CM | POA: Diagnosis not present

## 2021-12-23 NOTE — Telephone Encounter (Signed)
Prescription refill request received for warfarin ?Lov: Francisco Olsen 08/29/2021 ?Next INR check: 4/27 ?Warfarin tablet strength: 5mg   ?

## 2022-01-20 DIAGNOSIS — E041 Nontoxic single thyroid nodule: Secondary | ICD-10-CM | POA: Diagnosis not present

## 2022-01-20 DIAGNOSIS — E782 Mixed hyperlipidemia: Secondary | ICD-10-CM | POA: Diagnosis not present

## 2022-01-20 DIAGNOSIS — E11319 Type 2 diabetes mellitus with unspecified diabetic retinopathy without macular edema: Secondary | ICD-10-CM | POA: Diagnosis not present

## 2022-02-13 ENCOUNTER — Ambulatory Visit: Payer: Medicare Other

## 2022-02-13 DIAGNOSIS — I4821 Permanent atrial fibrillation: Secondary | ICD-10-CM | POA: Diagnosis not present

## 2022-02-13 DIAGNOSIS — Z5181 Encounter for therapeutic drug level monitoring: Secondary | ICD-10-CM | POA: Diagnosis not present

## 2022-02-13 LAB — POCT INR: INR: 2.5 (ref 2.0–3.0)

## 2022-02-13 NOTE — Patient Instructions (Signed)
Description   Continue taking Warfarin 1 tablet every day. Recheck in 8 weeks. Call us with any concerns and medications changes # [phone].      

## 2022-03-17 ENCOUNTER — Other Ambulatory Visit: Payer: Self-pay | Admitting: Cardiology

## 2022-03-18 NOTE — Telephone Encounter (Signed)
Prescription refill request received for warfarin  Lov: turner, 08/29/2021 Next INR check: 6/22 Warfarin tablet strength: 5mg    Refill sent.

## 2022-04-10 ENCOUNTER — Ambulatory Visit (INDEPENDENT_AMBULATORY_CARE_PROVIDER_SITE_OTHER): Payer: Medicare Other | Admitting: *Deleted

## 2022-04-10 DIAGNOSIS — I4821 Permanent atrial fibrillation: Secondary | ICD-10-CM

## 2022-04-10 DIAGNOSIS — Z5181 Encounter for therapeutic drug level monitoring: Secondary | ICD-10-CM

## 2022-04-10 LAB — POCT INR: INR: 2.8 (ref 2.0–3.0)

## 2022-04-10 NOTE — Patient Instructions (Signed)
Description   Continue taking Warfarin 1 tablet every day. Recheck in 8 weeks. Call us with any concerns and medications changes # [phone].

## 2022-06-05 ENCOUNTER — Ambulatory Visit: Payer: Medicare Other

## 2022-06-05 DIAGNOSIS — I4821 Permanent atrial fibrillation: Secondary | ICD-10-CM | POA: Diagnosis not present

## 2022-06-05 DIAGNOSIS — Z5181 Encounter for therapeutic drug level monitoring: Secondary | ICD-10-CM

## 2022-06-05 LAB — POCT INR: INR: 2.4 (ref 2.0–3.0)

## 2022-06-05 NOTE — Patient Instructions (Signed)
Continue taking Warfarin 1 tablet every day. Recheck in 8 weeks. Call us with any concerns and medications changes # [phone].  

## 2022-06-12 ENCOUNTER — Other Ambulatory Visit: Payer: Self-pay | Admitting: Cardiology

## 2022-08-01 ENCOUNTER — Ambulatory Visit: Payer: Medicare Other | Attending: Cardiology

## 2022-08-01 DIAGNOSIS — Z5181 Encounter for therapeutic drug level monitoring: Secondary | ICD-10-CM | POA: Diagnosis not present

## 2022-08-01 DIAGNOSIS — I4821 Permanent atrial fibrillation: Secondary | ICD-10-CM | POA: Diagnosis not present

## 2022-08-01 LAB — POCT INR: INR: 2.7 (ref 2.0–3.0)

## 2022-08-01 NOTE — Patient Instructions (Signed)
Continue taking Warfarin 1 tablet every day. Recheck in 8 weeks. Call us with any concerns and medications changes # [phone].

## 2022-08-06 DIAGNOSIS — I6529 Occlusion and stenosis of unspecified carotid artery: Secondary | ICD-10-CM | POA: Diagnosis not present

## 2022-08-06 DIAGNOSIS — D6869 Other thrombophilia: Secondary | ICD-10-CM | POA: Diagnosis not present

## 2022-08-06 DIAGNOSIS — I10 Essential (primary) hypertension: Secondary | ICD-10-CM | POA: Diagnosis not present

## 2022-08-06 DIAGNOSIS — L989 Disorder of the skin and subcutaneous tissue, unspecified: Secondary | ICD-10-CM | POA: Diagnosis not present

## 2022-08-06 DIAGNOSIS — Z0000 Encounter for general adult medical examination without abnormal findings: Secondary | ICD-10-CM | POA: Diagnosis not present

## 2022-08-06 DIAGNOSIS — E782 Mixed hyperlipidemia: Secondary | ICD-10-CM | POA: Diagnosis not present

## 2022-08-06 DIAGNOSIS — E11319 Type 2 diabetes mellitus with unspecified diabetic retinopathy without macular edema: Secondary | ICD-10-CM | POA: Diagnosis not present

## 2022-08-06 DIAGNOSIS — E041 Nontoxic single thyroid nodule: Secondary | ICD-10-CM | POA: Diagnosis not present

## 2022-08-06 DIAGNOSIS — I482 Chronic atrial fibrillation, unspecified: Secondary | ICD-10-CM | POA: Diagnosis not present

## 2022-09-08 ENCOUNTER — Other Ambulatory Visit: Payer: Self-pay | Admitting: Cardiology

## 2022-09-15 NOTE — Progress Notes (Unsigned)
Office Visit    Patient Name: [name] Date of Encounter: 09/16/2022  PCP:  [name], MD   [facility]  Cardiologist:  Armanda Magic, MD  Advanced Practice Provider:  No care team member to display Electrophysiologist:  None   HPI    Francisco Olsen is a 81 y.o. male with past medical history significant for chronic atrial fibrillation, hypertension, dyslipidemia, GERD and carotid artery stenosis presents today for annual follow-up appointment.  He was last seen 08/2021 for follow-up.  He denies chest pain/pressure, SOB, DOE, PND, orthopnea, LE edema, dizziness, palpitations or syncope.  Today, he has been doing some walking with his work. He is in the process of pitting some floors down. He does not have any major bleeding with the coumadin. He did have a cut on his finger. He has never been able to tell if he was in Afib. He is still having some lower ext edema and does not wear compression other than compressive socks that go up to mid calf. He stopped his statin on his own and does not want to be on any cholesterol medication. He has done some diet modifications. Last LDL was 192 which is above goal. Recommend repeat lipid panel through pcp.  Reports no shortness of breath nor dyspnea on exertion. Reports no chest pain, pressure, or tightness. No edema, orthopnea, PND. Reports no palpitations.    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   Hyperlipidemia    Hypertension    Past Surgical History:  Procedure Laterality Date   cardioversion x 2     CHOLECYSTECTOMY     HERNIA REPAIR     INGUINAL LYMPH NODE  BIOPSY Left 03/20/2020   Procedure: LEFT INGUINAL LYMPH NODE BIOPSY;  Surgeon: [name], MD;  Location: [facility];  Service: General;  Laterality: Left;   LAMINECTOMY     SEPTOPLASTY     TONSILECTOMY, ADENOIDECTOMY, BILATERAL MYRINGOTOMY AND TUBES      Allergies  Allergies  Allergen Reactions   Crestor [Rosuvastatin Calcium] Other (See Comments)    Myalgias   Lipitor [Atorvastatin] Other (See Comments)    Muscle aches   Plavix [Clopidogrel Bisulfate] Rash    EKGs/Labs/Other Studies Reviewed:   The following studies were reviewed today: Carotid dopplers 11/21  EKG:  EKG is  ordered today.  The ekg ordered today demonstrates rate controlled atrial fibrillation, rate 65 bpm  Recent Labs: No results found for requested labs within last 365 days.  Recent Lipid Panel    Component Value Date/Time   CHOL 293 (H) 01/26/2017 0800   TRIG 174 (H) 01/26/2017 0800   HDL 33 (L) 01/26/2017 0800   CHOLHDL 8.9 (H) 01/26/2017 0800   CHOLHDL 4.0 07/10/2016 0825   VLDL 25 07/10/2016 0825   LDLCALC 225 (H) 01/26/2017 0800    Risk Assessment/Calculations:   CHA2DS2-VASc Score = 5   This indicates a 7.2% annual risk of stroke. The patient's score is based upon: CHF History: 0 HTN History: 1 Diabetes History: 1 Stroke History: 0 Vascular Disease History: 1 Age Score: 2 Gender Score: 0  Home Medications   Current Meds  Medication Sig   amLODipine (NORVASC) 5 MG tablet Take 1 tablet (5 mg total) by mouth daily. Please keep upcoming appt for more future refills.   furosemide (LASIX) 20 MG tablet Take 20 mg by mouth as needed.   metoprolol tartrate (LOPRESSOR) 50 MG tablet Take 1 tablet (50 mg total) by mouth 2 (two) times daily. Pt needs to keep appointment for # 90 supply.   Omega-3 Fatty Acids (FISH OIL PO) Take 1,000 mg by mouth daily.    warfarin (COUMADIN) 5 MG tablet TAKE 1 TABLET BY MOUTH DAILY OR AS DIRECTED BY COUMADIN CLINIC     Review of  Systems      All other systems reviewed and are otherwise negative except as noted above.  Physical Exam    VS:  BP 124/60   Pulse 65   Ht 6' (1.829 m)   Wt 186 lb 12.8 oz (84.7 kg)   SpO2 98%   BMI 25.33 kg/m  , BMI Body mass index is 25.33 kg/m.  Wt Readings from Last 3 Encounters:  09/16/22 186 lb 12.8 oz (84.7 kg)  08/29/21 187 lb (84.8 kg)  08/29/20 185 lb 12.8 oz (84.3 kg)     GEN: Well nourished, well developed, in no acute distress. HEENT: normal. Neck: Supple, no JVD, carotid bruits, or masses. Cardiac: irregularly irregular, no murmurs, rubs, or gallops. No clubbing, cyanosis, edema.  Radials/PT 2+ and equal bilaterally.  Respiratory:  Respirations regular and unlabored, clear to auscultation bilaterally. GI: Soft, nontender, nondistended. MS: No deformity or atrophy. Skin: Warm and dry, no rash. Neuro:  Strength and sensation are intact. Psych: Normal affect.  Assessment & Plan    Permanent atrial fibrillation -remains in Afib, rate controlled -asymptomatic -on Coumadin for anticoagulation  Hypertension -blood pressure is well controlled -continue Norvasc 5mg  daily   Bilateral carotid artery stenosis -repeat of carotids  -No bruit on exam  Hyperlipidemia -LDL 192  -lipid panel with PCP -diet modification, he prefers to not be on any cholesterol medications  Lower extremity edema -he is taking lasix as needed  -recommended lower ext TED hose         Disposition: Follow up 1 year with [place], MD or APP.  Signed, Armanda Magic, PA-C 09/16/2022, 2:15 PM [facility]

## 2022-09-16 ENCOUNTER — Encounter: Payer: Self-pay | Admitting: Physician Assistant

## 2022-09-16 ENCOUNTER — Ambulatory Visit: Payer: Medicare Other | Attending: Physician Assistant | Admitting: Physician Assistant

## 2022-09-16 VITALS — BP 124/60 | HR 65 | Ht 72.0 in | Wt 186.8 lb

## 2022-09-16 DIAGNOSIS — E785 Hyperlipidemia, unspecified: Secondary | ICD-10-CM

## 2022-09-16 DIAGNOSIS — I6523 Occlusion and stenosis of bilateral carotid arteries: Secondary | ICD-10-CM | POA: Diagnosis not present

## 2022-09-16 DIAGNOSIS — I10 Essential (primary) hypertension: Secondary | ICD-10-CM

## 2022-09-16 DIAGNOSIS — I4821 Permanent atrial fibrillation: Secondary | ICD-10-CM

## 2022-09-16 DIAGNOSIS — M79606 Pain in leg, unspecified: Secondary | ICD-10-CM

## 2022-09-16 NOTE — Patient Instructions (Signed)
Medication Instructions:  Your physician recommends that you continue on your current medications as directed. Please refer to the Current Medication list given to you today.  *If you need a refill on your cardiac medications before your next appointment, please call your pharmacy*   Lab Work: Have your primary care provider draw a lipid panel when you see them If you have labs (blood work) drawn today and your tests are completely normal, you will receive your results only by: MyChart Message (if you have MyChart) OR A paper copy in the mail If you have any lab test that is abnormal or we need to change your treatment, we will call you to review the results.   Testing/Procedures: Your physician has requested that you have a carotid duplex. This test is an ultrasound of the carotid arteries in your neck. It looks at blood flow through these arteries that supply the brain with blood. Allow one hour for this exam. There are no restrictions or special instructions.    Follow-Up: At [facility], you and your health needs are our priority.  As part of our continuing mission to provide you with exceptional heart care, we have created designated Provider Care Teams.  These Care Teams include your primary Cardiologist (physician) and Advanced Practice Providers (APPs -  Physician Assistants and Nurse Practitioners) who all work together to provide you with the care you need, when you need it.  Your next appointment:   1 year(s)  The format for your next appointment:   In Person  Provider:   Armanda Magic, MD    Important Information About Sugar

## 2022-09-25 ENCOUNTER — Ambulatory Visit
Admission: RE | Admit: 2022-09-25 | Discharge: 2022-09-25 | Disposition: A | Discharge status: Home / Self Care | Payer: Medicare Other | Source: Ambulatory Visit | Attending: Physician Assistant | Admitting: Physician Assistant

## 2022-09-25 ENCOUNTER — Ambulatory Visit: Payer: Medicare Other

## 2022-09-25 DIAGNOSIS — I6523 Occlusion and stenosis of bilateral carotid arteries: Secondary | ICD-10-CM | POA: Insufficient documentation

## 2022-09-25 DIAGNOSIS — I4821 Permanent atrial fibrillation: Secondary | ICD-10-CM | POA: Insufficient documentation

## 2022-09-25 DIAGNOSIS — Z5181 Encounter for therapeutic drug level monitoring: Secondary | ICD-10-CM

## 2022-09-25 LAB — POCT INR: INR: 2.8 (ref 2.0–3.0)

## 2022-09-25 NOTE — Patient Instructions (Signed)
Description   Continue taking Warfarin 1 tablet every day. Recheck in 8 weeks. Call us with any concerns and medications changes # [phone].      

## 2022-09-30 DIAGNOSIS — L570 Actinic keratosis: Secondary | ICD-10-CM | POA: Diagnosis not present

## 2022-09-30 DIAGNOSIS — L814 Other melanin hyperpigmentation: Secondary | ICD-10-CM | POA: Diagnosis not present

## 2022-09-30 DIAGNOSIS — L821 Other seborrheic keratosis: Secondary | ICD-10-CM | POA: Diagnosis not present

## 2022-09-30 DIAGNOSIS — B078 Other viral warts: Secondary | ICD-10-CM | POA: Diagnosis not present

## 2022-09-30 DIAGNOSIS — L820 Inflamed seborrheic keratosis: Secondary | ICD-10-CM | POA: Diagnosis not present

## 2022-09-30 DIAGNOSIS — X32XXXA Exposure to sunlight, initial encounter: Secondary | ICD-10-CM | POA: Diagnosis not present

## 2022-10-02 ENCOUNTER — Other Ambulatory Visit: Payer: Self-pay | Admitting: Cardiology

## 2022-11-17 ENCOUNTER — Other Ambulatory Visit: Payer: Self-pay | Admitting: Cardiology

## 2022-11-20 ENCOUNTER — Ambulatory Visit: Payer: Medicare Other | Attending: Internal Medicine

## 2022-11-20 DIAGNOSIS — Z5181 Encounter for therapeutic drug level monitoring: Secondary | ICD-10-CM | POA: Diagnosis not present

## 2022-11-20 DIAGNOSIS — I4821 Permanent atrial fibrillation: Secondary | ICD-10-CM

## 2022-11-20 LAB — POCT INR: INR: 2.6 (ref 2.0–3.0)

## 2022-11-20 NOTE — Patient Instructions (Signed)
Description   Continue taking Warfarin 1 tablet every day. Recheck in 8 weeks. Call us with any concerns and medications changes # [phone].      

## 2023-01-06 DIAGNOSIS — H524 Presbyopia: Secondary | ICD-10-CM | POA: Diagnosis not present

## 2023-01-06 DIAGNOSIS — H2513 Age-related nuclear cataract, bilateral: Secondary | ICD-10-CM | POA: Diagnosis not present

## 2023-01-06 DIAGNOSIS — H43813 Vitreous degeneration, bilateral: Secondary | ICD-10-CM | POA: Diagnosis not present

## 2023-01-06 DIAGNOSIS — H5203 Hypermetropia, bilateral: Secondary | ICD-10-CM | POA: Diagnosis not present

## 2023-01-06 DIAGNOSIS — H25013 Cortical age-related cataract, bilateral: Secondary | ICD-10-CM | POA: Diagnosis not present

## 2023-01-06 DIAGNOSIS — E113291 Type 2 diabetes mellitus with mild nonproliferative diabetic retinopathy without macular edema, right eye: Secondary | ICD-10-CM | POA: Diagnosis not present

## 2023-01-15 ENCOUNTER — Ambulatory Visit: Payer: Medicare Other | Attending: Cardiology | Admitting: *Deleted

## 2023-01-15 DIAGNOSIS — I4821 Permanent atrial fibrillation: Secondary | ICD-10-CM

## 2023-01-15 DIAGNOSIS — Z5181 Encounter for therapeutic drug level monitoring: Secondary | ICD-10-CM | POA: Diagnosis not present

## 2023-01-15 LAB — POCT INR: POC INR: 2

## 2023-01-15 NOTE — Patient Instructions (Signed)
Description   Continue taking Warfarin 1 tablet every day. Recheck in 8 weeks. Call us with any concerns and medications changes # [phone].

## 2023-01-26 ENCOUNTER — Other Ambulatory Visit: Payer: Self-pay | Admitting: Family Medicine

## 2023-01-26 DIAGNOSIS — E11319 Type 2 diabetes mellitus with unspecified diabetic retinopathy without macular edema: Secondary | ICD-10-CM | POA: Diagnosis not present

## 2023-01-26 DIAGNOSIS — R109 Unspecified abdominal pain: Secondary | ICD-10-CM

## 2023-01-26 DIAGNOSIS — E041 Nontoxic single thyroid nodule: Secondary | ICD-10-CM | POA: Diagnosis not present

## 2023-01-26 DIAGNOSIS — H3500 Unspecified background retinopathy: Secondary | ICD-10-CM | POA: Diagnosis not present

## 2023-01-29 ENCOUNTER — Ambulatory Visit
Admission: RE | Admit: 2023-01-29 | Discharge: 2023-01-29 | Disposition: A | Discharge status: Home / Self Care | Payer: Medicare Other | Source: Ambulatory Visit | Attending: Family Medicine | Admitting: Family Medicine

## 2023-01-29 DIAGNOSIS — K869 Disease of pancreas, unspecified: Secondary | ICD-10-CM | POA: Diagnosis not present

## 2023-01-29 DIAGNOSIS — R109 Unspecified abdominal pain: Secondary | ICD-10-CM

## 2023-01-29 DIAGNOSIS — K6389 Other specified diseases of intestine: Secondary | ICD-10-CM | POA: Diagnosis not present

## 2023-01-29 DIAGNOSIS — N201 Calculus of ureter: Secondary | ICD-10-CM | POA: Diagnosis not present

## 2023-01-29 DIAGNOSIS — K5900 Constipation, unspecified: Secondary | ICD-10-CM | POA: Diagnosis not present

## 2023-01-29 MED ORDER — IOPAMIDOL (ISOVUE-300) INJECTION 61%
100.0000 mL | Freq: Once | INTRAVENOUS | Status: AC | PRN
  Administered 2023-01-29: 100 mL via INTRAVENOUS

## 2023-02-06 ENCOUNTER — Other Ambulatory Visit: Payer: Medicare Other

## 2023-02-06 ENCOUNTER — Encounter: Payer: Self-pay | Admitting: Hematology

## 2023-02-06 ENCOUNTER — Other Ambulatory Visit: Payer: Self-pay

## 2023-02-06 ENCOUNTER — Inpatient Hospital Stay: Payer: Medicare Other | Attending: Hematology | Admitting: Hematology

## 2023-02-06 VITALS — BP 121/61 | HR 68 | Temp 97.9°F | Resp 18 | Ht 70.5 in | Wt 177.2 lb

## 2023-02-06 DIAGNOSIS — R590 Localized enlarged lymph nodes: Secondary | ICD-10-CM | POA: Insufficient documentation

## 2023-02-06 DIAGNOSIS — E119 Type 2 diabetes mellitus without complications: Secondary | ICD-10-CM | POA: Diagnosis not present

## 2023-02-06 DIAGNOSIS — Z7901 Long term (current) use of anticoagulants: Secondary | ICD-10-CM | POA: Diagnosis not present

## 2023-02-06 DIAGNOSIS — K8689 Other specified diseases of pancreas: Secondary | ICD-10-CM | POA: Diagnosis not present

## 2023-02-06 DIAGNOSIS — I10 Essential (primary) hypertension: Secondary | ICD-10-CM | POA: Insufficient documentation

## 2023-02-06 DIAGNOSIS — I482 Chronic atrial fibrillation, unspecified: Secondary | ICD-10-CM | POA: Diagnosis not present

## 2023-02-06 DIAGNOSIS — K769 Liver disease, unspecified: Secondary | ICD-10-CM | POA: Diagnosis not present

## 2023-02-06 NOTE — Progress Notes (Signed)
[facility]   [phone] [phone]   Clinic New Consult Note   Patient Care Team: [name], MD as PCP - General (Family Medicine) Quintella Reichert, MD as PCP - Cardiology (Cardiology) Quintella Reichert, MD as Consulting Physician (Cardiology) 02/06/2023  CHIEF COMPLAINTS/PURPOSE OF CONSULTATION:  Pancreatic mass  REFERRAL PHYSICIAN: Dr.Shaw   HISTORY OF PRESENTING ILLNESS:  Francisco Olsen 82 y.o. male is here because of recently discovered pancreatic mass and liver lesions on CT scan.  He was referred by his primary care physician Dr. Clelia Croft.  He presents to the clinic with his wife today.  Patient reports epigastric discomfort/pain, anorexia, taste change, and fatigue for the past few months.  He has lost about 4 to 6 pounds in the past 3 months.  He described his epigastric discomfort is dull achiness, persistent, no associated nausea or vomiting.  His bowel movement has been unremarkable, no hematochezia.  He is still able to function at home including some yard work.  He was seen by her primary care physician due to the persistent symptoms, and a CT scan of abdomen and pelvis with contrast was obtained on January 29, 2023, which showed a mass in the tail of pancreas measuring 3.0 x 1.8 cm, and multiple hypodense liver lesions, the largest measuring 4.1 cm in left lobe, which is highly concerning for metastatic cancer.  Patient was referred to [place] for further workup.   He lives with his wife at their own home, they live independently.  He still works part-time as a Naval architect for [employer].  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   Hyperlipidemia    Hypertension     SURGICAL HISTORY: Past Surgical History:  Procedure Laterality Date   cardioversion x 2     CHOLECYSTECTOMY     HERNIA REPAIR     INGUINAL LYMPH NODE BIOPSY Left 03/20/2020   Procedure: LEFT INGUINAL LYMPH NODE BIOPSY;  Surgeon: [name], MD;  Location: [facility];  Service: General;  Laterality: Left;   LAMINECTOMY     SEPTOPLASTY     TONSILECTOMY, ADENOIDECTOMY, BILATERAL MYRINGOTOMY AND TUBES      SOCIAL HISTORY: Social History   Socioeconomic History   Marital status: Married    Spouse name: Not on file   Number of children: 2   Years of education: Not on file   Highest education level: Not on file  Occupational History   Not on file  Tobacco Use   Smoking status: Never   Smokeless tobacco: Never  Vaping Use   Vaping Use: Never used  Substance and Sexual Activity   Alcohol use: No   Drug use: No   Sexual activity: Not on file  Other Topics Concern   Not on file  Social History Narrative   Not on file   Social Determinants of Health   Financial Resource Strain: Not on file  Food Insecurity: Not on file  Transportation Needs: Not on file  Physical Activity: Not on file  Stress: Not on file  Social Connections: Not on file  Intimate Partner Violence: Not on file    FAMILY HISTORY: Family  History  Problem Relation Age of Onset   CAD Father    CAD Sister    Diabetes Mellitus II Sister    CAD Brother    Diabetes Mellitus II Brother     ALLERGIES:  is allergic to crestor [rosuvastatin calcium], lipitor [atorvastatin], and plavix [clopidogrel bisulfate].  MEDICATIONS:  Current Outpatient Medications  Medication Sig Dispense Refill   amLODipine (NORVASC) 5 MG tablet TAKE 1 TABLET (5 MG TOTAL) BY MOUTH DAILY. PLEASE KEEP UPCOMING APPT FOR MORE FUTURE REFILLS. 90 tablet 3   metoprolol tartrate (LOPRESSOR) 50 MG tablet TAKE 1 TABLET BY MOUTH 2 (TWO) TIMES DAILY. PT NEEDS TO KEEP  APPOINTMENT FOR # 90 SUPPLY. 180 tablet 3   Omega-3 Fatty Acids (FISH OIL PO) Take 1,000 mg by mouth daily.      warfarin (COUMADIN) 5 MG tablet TAKE 1 TABLET BY MOUTH DAILY OR AS DIRECTED BY COUMADIN CLINIC 100 tablet 0   No current facility-administered medications for this visit.    REVIEW OF SYSTEMS:   Constitutional: Denies fevers, chills or abnormal night sweats, (+) malaise and 4 pound weight loss Eyes: Denies blurriness of vision, double vision or watery eyes Ears, nose, mouth, throat, and face: Denies mucositis or sore throat Respiratory: Denies cough, dyspnea or wheezes Cardiovascular: Denies palpitation, chest discomfort or lower extremity swelling Gastrointestinal:  see HPI  Skin: Denies abnormal skin rashes Lymphatics: Denies new lymphadenopathy or easy bruising Neurological:Denies numbness, tingling or new weaknesses Behavioral/Psych: Mood is stable, no new changes  All other systems were reviewed with the patient and are negative.  PHYSICAL EXAMINATION: ECOG PERFORMANCE STATUS: 1 - Symptomatic but completely ambulatory  Vitals:   02/06/23 1147  BP: 121/61  Pulse: 68  Resp: 18  Temp: 97.9 F (36.6 C)  SpO2: 99%   Filed Weights   02/06/23 1147  Weight: 177 lb 3.2 oz (80.4 kg)    GENERAL:alert, no distress and comfortable SKIN: skin color, texture, turgor are normal, no rashes or significant lesions EYES: normal, conjunctiva are pink and non-injected, sclera clear OROPHARYNX:no exudate, no erythema and lips, buccal mucosa, and tongue normal  NECK: supple, thyroid normal size, non-tender, without nodularity LYMPH:  no palpable lymphadenopathy in the cervical, axillary or inguinal LUNGS: clear to auscultation and percussion with normal breathing effort HEART: regular rate & rhythm and no murmurs and no lower extremity edema ABDOMEN:abdomen soft, non-tender and normal bowel sounds Musculoskeletal:no cyanosis of digits and no clubbing  PSYCH: alert & oriented x  3 with fluent speech NEURO: no focal motor/sensory deficits  LABORATORY DATA:  I have reviewed the data as listed    Latest Ref Rng & Units 08/29/2021    9:17 AM 09/06/2020    9:28 AM 07/19/2007    7:45 AM  CBC  WBC 3.4 - 10.8 x10E3/uL 7.3  7.6  9.2   Hemoglobin 13.0 - 17.7 g/dL 54.0  98.1  19.1   Hematocrit 37.5 - 51.0 % 39.4  39.9  41.4   Platelets 150 - 450 x10E3/uL 176  173  213     @  RADIOGRAPHIC STUDIES: I have personally reviewed the radiological images as listed and agreed with the findings in the report. CT ABDOMEN PELVIS W CONTRAST  Result Date: 02/01/2023 CLINICAL DATA:  Abdominal pain with history of constipation. EXAM: CT ABDOMEN AND PELVIS WITH CONTRAST TECHNIQUE: Multidetector CT imaging of the abdomen and pelvis was performed using the standard protocol following bolus administration of intravenous contrast. RADIATION DOSE REDUCTION: This exam was performed according  to the departmental dose-optimization program which includes automated exposure control, adjustment of the mA and/or kV according to patient size and/or use of iterative reconstruction technique. CONTRAST:  ISOVUE-300 IOPAMIDOL (ISOVUE-300) INJECTION 61% COMPARISON:  12/06/2019, 08/22/2019. FINDINGS: Lower chest: The heart is enlarged. Mild atelectasis is present at the lung bases. There is a 5 mm nodule is present in the right middle lobe, axial image 5. There is a 6 mm nodule in the right lower lobe, axial image 17. Hepatobiliary: Multiple ill-defined hypodensities are present in the liver, the largest measuring 4.1 cm. No biliary ductal dilatation for patient's cholecystectomy status. Pancreas: There is a ill-defined hypodense mass in the tail of the pancreas measuring 3.0 x 1.8 cm. No pancreatic ductal dilatation or surrounding fat stranding. Spleen: Normal size. There is a vague hypodensity in the spleen measuring 2.3 cm,, likely present in 2020 and not significantly changed. Adrenals/Urinary Tract:  The adrenal glands are within normal limits. The kidneys enhance symmetrically. Cysts are noted in the kidneys bilaterally. Hyperdense lesion is noted along the mid left kidney measuring 1.1 cm, likely hemorrhagic or proteinaceous cyst. No renal calculus or hydronephrosis. Forty-five mm calculus is noted in the distal right ureter with mild dilatation of the pelvic ureter. No hydronephrosis bilaterally. No bladder wall thickening. Stomach/Bowel: Stomach is within normal limits. A small diverticulum is noted at the second portion of the duodenum. Appendix appears normal. There is thickening of the walls of the terminal ileum with scattered diverticula along the distal ileum. No free air or pneumatosis. Scattered diverticula are noted along the colon without evidence of diverticulitis. Moderate amount of retained stool in the colon. Vascular/Lymphatic: Aortic atherosclerosis. Multiple enlarged lymph nodes are present at the porta hepatis and gastrohepatic ligament. Reproductive: Prostate gland is markedly enlarged with a nodule of the protrudes into the base of the urinary bladder. Other: Mild ascites in all 4 quadrants. Musculoskeletal: Degenerative changes are present in the thoracolumbar spine. No acute or suspicious osseous abnormality. IMPRESSION: 1. Ill-defined mass in the tail of the pancreas, concerning for pancreatic adenocarcinoma. 2. Multiple scattered hypodensities in the liver with enlarged lymph nodes at the porta hepatis and gastrohepatic ligament, suggesting metastatic disease. 3. Thickening of the walls of the terminal ileum, possible infectious or inflammatory ileitis. 4. 5 mm calculus in the distal right ureter with dilatation of the pelvic ureter on the right. No hydronephrosis. 5. Pulmonary nodules in the right middle and lower lobes measuring up to 6 mm. Non-contrast chest CT at 6-12 months is recommended. If the nodule is stable at time of repeat CT, then future CT at 18-24 months (from today's  scan) is considered optional for low-risk patients, but is recommended for high-risk patients. This recommendation follows the consensus statement: Guidelines for Management of Incidental Pulmonary Nodules Detected on CT Images: From the Fleischner Society 2017; Radiology 2017; 284:228-243. 6. Moderate amount of retained stool in the colon. 7. Mild ascites. 8. Cardiomegaly with coronary artery calcifications and aortic atherosclerosis. 9. Enlarged prostate gland. 10. Remaining incidental findings as described above. Electronically Signed   By: Thornell Sartorius M.D.   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  Multiple scattered hypodensities in the liver with enlarged lymph nodes at the porta habitus and gastrohepatic ligament, suggesting metastatic disease. -I recommend ultrasound-guided liver  biopsy to confirm diagnosis, and obtain tissue for molecular testing.  He initially was reluctant, after discussion, he agrees to proceed. -I briefly discussed treatment options for metastatic pancreatic cancer, mainly with chemotherapy.  Due to his advanced age, he is not a candidate for intensive chemotherapy such as FOLFIRINOX.  I will probably recommend single agent gemcitabine, if he tolerates well, will add on Abraxane.  -I plan to see him back after his liver biopsy. -Will obtain genetic testing if liver biopsy confirms metastatic pancreatic cancer.  Plan -Ultrasound-guided liver biopsy by IR as soon as possible -Will obtain genetic testing and tumor marker if the biopsy confirms metastatic pancreatic cancer -I will see him back after liver biopsy.  If patient is interested in treatment, I will obtain CT chest to complete staging.   All questions were answered. The patient knows to call the clinic with any  problems, questions or concerns. I spent 40 minutes counseling the patient face to face. The total time spent in the appointment was 50 minutes and more than 50% was on counseling.     Malachy Mood, MD 02/06/2023

## 2023-02-09 ENCOUNTER — Telehealth: Payer: Self-pay | Admitting: *Deleted

## 2023-02-09 DIAGNOSIS — Z01818 Encounter for other preprocedural examination: Secondary | ICD-10-CM

## 2023-02-09 DIAGNOSIS — I4821 Permanent atrial fibrillation: Secondary | ICD-10-CM

## 2023-02-09 DIAGNOSIS — Z5181 Encounter for therapeutic drug level monitoring: Secondary | ICD-10-CM

## 2023-02-09 NOTE — Progress Notes (Signed)
Berdine Dance, MD  Georgeann Oppenheim for US liver met core bx.  Met panc ca by imaging.  Trace ascites, will need to scan and pick a lesion.  And also decide on need for para(doubtful but never know)  See CT 01/29/23  TS

## 2023-02-09 NOTE — Telephone Encounter (Signed)
   Pre-operative Risk Assessment    Patient Name: Francisco Olsen  DOB: April 29, 1941 MRN: [id number]      Request for Surgical Clearance    Procedure:   [place] CORE LIVER Bx  Date of Surgery:  Clearance 02/25/23                                 Surgeon:  NOT INDICATED Surgeon's Group or Practice Name:  [facility] Phone number:  [phone] Fax number:  [phone]   Type of Clearance Requested:   - Medical  - Pharmacy:  Hold Warfarin (Coumadin)     Type of Anesthesia:   MODERATE SEDATION   Additional requests/questions:    Elpidio Anis   02/09/2023, 6:09 PM

## 2023-02-10 ENCOUNTER — Telehealth: Payer: Self-pay | Admitting: *Deleted

## 2023-02-10 ENCOUNTER — Encounter: Payer: Self-pay | Admitting: Nurse Practitioner

## 2023-02-10 ENCOUNTER — Ambulatory Visit: Payer: Medicare Other | Attending: Nurse Practitioner | Admitting: Nurse Practitioner

## 2023-02-10 DIAGNOSIS — I4821 Permanent atrial fibrillation: Secondary | ICD-10-CM | POA: Diagnosis not present

## 2023-02-10 DIAGNOSIS — Z5181 Encounter for therapeutic drug level monitoring: Secondary | ICD-10-CM | POA: Diagnosis not present

## 2023-02-10 DIAGNOSIS — Z01818 Encounter for other preprocedural examination: Secondary | ICD-10-CM | POA: Diagnosis not present

## 2023-02-10 DIAGNOSIS — Z01810 Encounter for preprocedural cardiovascular examination: Secondary | ICD-10-CM

## 2023-02-10 LAB — BASIC METABOLIC PANEL

## 2023-02-10 LAB — CBC
Hemoglobin: 13.8 g/dL (ref 13.0–17.7)
MCV: 85 fL (ref 79–97)
Platelets: 185 10*3/uL (ref 150–450)
RBC: 4.81 x10E6/uL (ref 4.14–5.80)
WBC: 12 10*3/uL — ABNORMAL HIGH (ref 3.4–10.8)

## 2023-02-10 NOTE — Telephone Encounter (Signed)
I called pt's wife and asked if pt had not had labs yet, he could skip them, though if he had them done that was perfectly fine. Pt's wife said they just left from him getting labs done. I said that is ok updated labs are always a good thing. I did inform them though will need to do tele app today at 3;20 it they are available. Pt's wife said that is fine. I will schedule for 3:20. Consent is done but meds are not done as they were in the car.

## 2023-02-10 NOTE — Telephone Encounter (Signed)
I s/w both the pt and his wife and they are aware that pt will need labs today bmet, cbc. Pt will have labs done at [facility] location. Pt and his wife have been made aware that Dr. Mosetta Putt office called [place] today stating Dr. Mosetta Putt would like to move procedure up from 02/25/23 to 02/17/23. I stated that we will do everything we can to help this happen. I did tell the pt that he may need a tele pre op appt as well once labs are in if clearance has not been finalized with the labs. Pt agreeable to plan of care.   I will update Dr. Mosetta Putt office as well.

## 2023-02-10 NOTE — Telephone Encounter (Signed)
I called pt's wife and asked if pt had not had labs yet, he could skip them, though if he had them done that was perfectly fine. Pt's wife said they just left from him getting labs done. I said that is ok updated labs are always a good thing. I did inform them though will need to do tele app today at 3;20 it they are available. Pt's wife said that is fine. I will schedule for 3:20. Consent is done but meds are not done as they were in the car.     Patient Consent for Virtual Visit        Francisco Olsen has provided verbal consent on 02/10/2023 for a virtual visit (video or telephone).   CONSENT FOR VIRTUAL VISIT FOR:  Francisco Olsen  By participating in this virtual visit I agree to the following:  I hereby voluntarily request, consent and authorize [facility] and its employed or contracted physicians, physician assistants, nurse practitioners or other licensed health care professionals (the Practitioner), to provide me with telemedicine health care services (the "Services") as deemed necessary by the treating Practitioner. I acknowledge and consent to receive the Services by the Practitioner via telemedicine. I understand that the telemedicine visit will involve communicating with the Practitioner through live audiovisual communication technology and the disclosure of certain medical information by electronic transmission. I acknowledge that I have been given the opportunity to request an in-person assessment or other available alternative prior to the telemedicine visit and am voluntarily participating in the telemedicine visit.  I understand that I have the right to withhold or withdraw my consent to the use of telemedicine in the course of my care at any time, without affecting my right to future care or treatment, and that the Practitioner or I may terminate the telemedicine visit at any time. I understand that I have the right to inspect all information obtained and/or recorded in the  course of the telemedicine visit and may receive copies of available information for a reasonable fee.  I understand that some of the potential risks of receiving the Services via telemedicine include:  Delay or interruption in medical evaluation due to technological equipment failure or disruption; Information transmitted may not be sufficient (e.g. poor resolution of images) to allow for appropriate medical decision making by the Practitioner; and/or  In rare instances, security protocols could fail, causing a breach of personal health information.  Furthermore, I acknowledge that it is my responsibility to provide information about my medical history, conditions and care that is complete and accurate to the best of my ability. I acknowledge that Practitioner's advice, recommendations, and/or decision may be based on factors not within their control, such as incomplete or inaccurate data provided by me or distortions of diagnostic images or specimens that may result from electronic transmissions. I understand that the practice of medicine is not an exact science and that Practitioner makes no warranties or guarantees regarding treatment outcomes. I acknowledge that a copy of this consent can be made available to me via my patient portal [facility] MyChart), or I can request a printed copy by calling the office of [facility].    I understand that my insurance will be billed for this visit.   I have read or had this consent read to me. I understand the contents of this consent, which adequately explains the benefits and risks of the Services being provided via telemedicine.  I have been provided ample opportunity to ask questions regarding  this consent and the Services and have had my questions answered to my satisfaction. I give my informed consent for the services to be provided through the use of telemedicine in my medical care

## 2023-02-10 NOTE — Telephone Encounter (Signed)
Pre op APP sent secure stating the pharm-d now states the pt can hold warfarin x 5 days. Per pre op op APP if pt has already had lab work done that is fine as this will be an update in the chart. Pre op APP states the pt needs to be added on today for tele visit before we can sign off on the clearance. I left message to please call back to schedule tele pre op appt. I will add him to 3:20 and wait for him to confirm appt.

## 2023-02-10 NOTE — Telephone Encounter (Addendum)
Patient with diagnosis of afib on Warfarin for anticoagulation.    Procedure: [place] CORE LIVER Bx  Date of procedure: 02/25/2023 Request to move procedure to 02/17/2023   CHA2DS2-VASc Score = 5   This indicates a 7.2% annual risk of stroke. The patient's score is based upon: CHF History: 0 HTN History: 1 Diabetes History: 1 Stroke History: 0 Vascular Disease History: 1 Age Score: 2 Gender Score: 0     CrCl >90 mL/min (SrCr 0.68 on 01/26/2023 From [facility]) Platelet count need updated lab last one from 2022   Per office protocol, patient can hold warfarin for 5 days prior to procedure.   Patient will not need bridging with Lovenox (enoxaparin) around procedure.  **This guidance is not considered finalized until pre-operative APP has relayed final recommendations.**

## 2023-02-10 NOTE — Telephone Encounter (Signed)
Ok to hold warfarin 5 days. Lovenox bridge is NOT needed.

## 2023-02-10 NOTE — Progress Notes (Signed)
Virtual Visit via Telephone Note   Because of [name]'s co-morbid illnesses, he is at least at moderate risk for complications without adequate follow up.  This format is felt to be most appropriate for this patient at this time.  The patient did not have access to video technology/had technical difficulties with video requiring transitioning to audio format only (telephone).  All issues noted in this document were discussed and addressed.  No physical exam could be performed with this format.  Please refer to the patient's chart for his consent to telehealth for [facility].  Evaluation Performed:  Preoperative cardiovascular risk assessment _____________   Date:  02/10/2023   Patient ID:  Francisco Olsen, DOB 1941/06/15, MRN [id number] Patient Location:  Home Provider location:   Office  Primary Care Provider:  [name], MD Primary Cardiologist:  Armanda Magic, MD  Chief Complaint / Patient Profile   82 y.o. y/o male with a h/o chronic atrial fibrillation on chronic anticoagulation, dyslipidemia, HTN, carotid artery stenosis, GERD, and lower extremity edema who is pending ultrasound core liver biopsy and presents today for telephonic preoperative cardiovascular risk assessment.  History of Present Illness    Francisco Olsen is a 82 y.o. male who presents via audio/video conferencing for a telehealth visit today.  Pt was last seen in cardiology clinic on 09/16/22 by Jari Favre, PA.  At that time Francisco Olsen was doing well.  The patient is now pending procedure as outlined above. Since his last visit, he denies chest pain, shortness of breath, lower extremity edema, fatigue, palpitations, melena, hematuria, hemoptysis, diaphoresis, weakness, presyncope, syncope, orthopnea, and PND. He remains active working in his yard and his shop and can complete > 4 METS activity without concerning cardiac symptoms.   Past Medical History    Past Medical History:  Diagnosis  Date   BPH (benign prostatic hyperplasia)    Carotid artery stenosis    1-39% bilateral by dopplers 08/2020   Chronic atrial fibrillation    on chronic systemic anticoagulation with warfarin   Diabetes mellitus without complication    GERD (gastroesophageal reflux disease)    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   Hyperlipidemia    Hypertension    Past Surgical History:  Procedure Laterality Date   cardioversion x 2     CHOLECYSTECTOMY     HERNIA REPAIR     INGUINAL LYMPH NODE BIOPSY Left 03/20/2020   Procedure: LEFT INGUINAL LYMPH NODE BIOPSY;  Surgeon: [name], MD;  Location: [facility];  Service: General;  Laterality: Left;   LAMINECTOMY     SEPTOPLASTY     TONSILECTOMY, ADENOIDECTOMY, BILATERAL MYRINGOTOMY AND TUBES      Allergies  Allergies  Allergen Reactions   Crestor [Rosuvastatin Calcium] Other (See Comments)    Myalgias   Lipitor [Atorvastatin] Other (See Comments)    Muscle aches   Plavix [Clopidogrel Bisulfate] Rash    Home Medications    Prior to Admission medications   Medication Sig Start Date End Date Taking? Authorizing Provider  amLODipine (NORVASC) 5 MG tablet TAKE 1 TABLET (5 MG TOTAL) BY MOUTH DAILY. PLEASE KEEP UPCOMING APPT FOR MORE FUTURE REFILLS. 11/17/22   Quintella Reichert, MD  metoprolol tartrate (LOPRESSOR) 50 MG tablet TAKE 1 TABLET BY MOUTH 2 (TWO) TIMES DAILY. PT NEEDS TO KEEP APPOINTMENT FOR # 90 SUPPLY. 10/02/22   Quintella Reichert, MD  Omega-3 Fatty Acids (FISH OIL PO) Take 1,000 mg by mouth daily.     [provider]  warfarin (COUMADIN) 5 MG tablet TAKE 1 TABLET BY MOUTH DAILY OR AS DIRECTED BY COUMADIN CLINIC 11/17/22   Quintella Reichert, MD    Physical Exam    Vital Signs:  Francisco Olsen does not have vital signs available for review today.  Given telephonic nature of communication, physical exam is limited. AAOx3. NAD. Normal affect.  Speech and respirations are unlabored.  Accessory Clinical Findings     None  Assessment & Plan    1.  Preoperative Cardiovascular Risk Assessment: According to the Revised Cardiac Risk Index (RCRI), his Perioperative Risk of Major Cardiac Event is (%): 0.9. His Functional Capacity in METs is: 6.61 according to the Duke Activity Status Index (DASI). The patient is doing well from a cardiac perspective. Therefore, based on ACC/AHA guidelines, the patient would be at acceptable risk for the planned procedure without further cardiovascular testing.   The patient was advised that if he develops new symptoms prior to surgery to contact our office to arrange for a follow-up visit, and he verbalized understanding.  Per office protocol, he may hold warfarin for 5 days for upcoming procedure. Lovenox bridge is not required.  A copy of this note will be routed to requesting surgeon.  Time:   Today, I have spent 7 minutes with the patient with telehealth technology discussing medical history, symptoms, and management plan.     Levi Aland, NP-C  02/10/2023, 3:20 PM [address], [address] Office [phone] Fax [phone]

## 2023-02-11 LAB — CBC
Hematocrit: 40.9 % (ref 37.5–51.0)
MCH: 28.7 pg (ref 26.6–33.0)
MCHC: 33.7 g/dL (ref 31.5–35.7)
RDW: 14.2 % (ref 11.6–15.4)

## 2023-02-11 LAB — BASIC METABOLIC PANEL
BUN/Creatinine Ratio: 18 (ref 10–24)
BUN: 14 mg/dL (ref 8–27)
CO2: 23 mmol/L (ref 20–29)
Calcium: 10.2 mg/dL (ref 8.6–10.2)
Chloride: 97 mmol/L (ref 96–106)
Creatinine, Ser: 0.78 mg/dL (ref 0.76–1.27)

## 2023-02-12 ENCOUNTER — Other Ambulatory Visit: Payer: Self-pay

## 2023-02-12 DIAGNOSIS — K8689 Other specified diseases of pancreas: Secondary | ICD-10-CM

## 2023-02-12 NOTE — Progress Notes (Signed)
I spoke with Francisco Olsen I explained my role as a nurse navigator and provided my contact information.  They are able to be here 02/20/2023 at 1400 for biopsy follow up appt with lab appt at 1330.  All questions were answered.  She verbalized understanding.

## 2023-02-13 ENCOUNTER — Other Ambulatory Visit: Payer: Self-pay | Admitting: Radiology

## 2023-02-13 DIAGNOSIS — K769 Liver disease, unspecified: Secondary | ICD-10-CM

## 2023-02-16 ENCOUNTER — Other Ambulatory Visit: Payer: Self-pay | Admitting: Student

## 2023-02-17 ENCOUNTER — Ambulatory Visit (HOSPITAL_COMMUNITY)
Admission: RE | Admit: 2023-02-17 | Discharge: 2023-02-17 | Disposition: A | Discharge status: Home / Self Care | Payer: Medicare Other | Source: Ambulatory Visit | Attending: Hematology | Admitting: Hematology

## 2023-02-17 ENCOUNTER — Other Ambulatory Visit: Payer: Self-pay

## 2023-02-17 DIAGNOSIS — R160 Hepatomegaly, not elsewhere classified: Secondary | ICD-10-CM | POA: Insufficient documentation

## 2023-02-17 DIAGNOSIS — K769 Liver disease, unspecified: Secondary | ICD-10-CM | POA: Diagnosis not present

## 2023-02-17 DIAGNOSIS — K869 Disease of pancreas, unspecified: Secondary | ICD-10-CM | POA: Diagnosis not present

## 2023-02-17 DIAGNOSIS — K8689 Other specified diseases of pancreas: Secondary | ICD-10-CM

## 2023-02-17 DIAGNOSIS — R911 Solitary pulmonary nodule: Secondary | ICD-10-CM | POA: Diagnosis not present

## 2023-02-17 LAB — PROTIME-INR
INR: 1.3 — ABNORMAL HIGH (ref 0.8–1.2)
Prothrombin Time: 16 seconds — ABNORMAL HIGH (ref 11.4–15.2)

## 2023-02-17 LAB — CBC
HCT: 41.1 % (ref 39.0–52.0)
Hemoglobin: 13.2 g/dL (ref 13.0–17.0)
MCH: 28.6 pg (ref 26.0–34.0)
MCHC: 32.1 g/dL (ref 30.0–36.0)
MCV: 89.2 fL (ref 80.0–100.0)
Platelets: 220 10*3/uL (ref 150–400)
RBC: 4.61 MIL/uL (ref 4.22–5.81)
RDW: 15.9 % — ABNORMAL HIGH (ref 11.5–15.5)
WBC: 11.9 10*3/uL — ABNORMAL HIGH (ref 4.0–10.5)
nRBC: 0 % (ref 0.0–0.2)

## 2023-02-17 LAB — GLUCOSE, CAPILLARY: Glucose-Capillary: 120 mg/dL — ABNORMAL HIGH (ref 70–99)

## 2023-02-17 MED ORDER — MIDAZOLAM HCL 2 MG/2ML IJ SOLN
INTRAMUSCULAR | Status: AC
  Filled 2023-02-17: qty 2

## 2023-02-17 MED ORDER — SODIUM CHLORIDE 0.9 % IV SOLN: INTRAVENOUS | Status: DC

## 2023-02-17 MED ORDER — FENTANYL CITRATE (PF) 100 MCG/2ML IJ SOLN
INTRAMUSCULAR | Status: AC | PRN
  Administered 2023-02-17 (×4): 25 ug via INTRAVENOUS

## 2023-02-17 MED ORDER — MIDAZOLAM HCL 2 MG/2ML IJ SOLN
INTRAMUSCULAR | Status: AC | PRN
  Administered 2023-02-17: .5 mg via INTRAVENOUS
  Administered 2023-02-17: 1 mg via INTRAVENOUS
  Administered 2023-02-17: .5 mg via INTRAVENOUS

## 2023-02-17 MED ORDER — GELATIN ABSORBABLE 12-7 MM EX MISC
1.0000 | Freq: Once | CUTANEOUS | Status: AC
  Administered 2023-02-17: 1 via TOPICAL

## 2023-02-17 MED ORDER — FENTANYL CITRATE (PF) 100 MCG/2ML IJ SOLN
INTRAMUSCULAR | Status: AC
  Filled 2023-02-17: qty 2

## 2023-02-17 MED ORDER — LIDOCAINE HCL (PF) 1 % IJ SOLN
6.0000 mL | Freq: Once | INTRAMUSCULAR | Status: AC
  Administered 2023-02-17: 6 mL via INTRADERMAL

## 2023-02-17 NOTE — H&P (Signed)
Chief Complaint: Patient was seen in consultation today for liver lesion biopsy at the request of Feng,Yan  Referring Physician(s): Feng,Yan  Supervising Physician: Roanna Banning  Patient Status: [facility] - Out-pt  History of Present Illness: Mubarak Bevens is a 82 y.o. male   Recently discovered pancreatic mass and liver lesions New epigastric pain and anorexia x 3 months Wt loss Hx Afib and carotid stenosis--- on Coumadin daily LD 5 days ago  CT 01/29/23:  IMPRESSION: 1. Ill-defined mass in the tail of the pancreas, concerning for pancreatic adenocarcinoma. 2. Multiple scattered hypodensities in the liver with enlarged lymph nodes at the porta hepatis and gastrohepatic ligament, suggesting metastatic disease. 3. Thickening of the walls of the terminal ileum, possible infectious or inflammatory ileitis. 4. 5 mm calculus in the distal right ureter with dilatation of the pelvic ureter on the right. No hydronephrosis. 5. Pulmonary nodules in the right middle and lower lobes measuring up to 6 mm. Non-contrast chest CT at 6-12 months is recommended. If the nodule is stable at time of repeat CT, then future CT at 18-24 months (from today's scan) is considered optional for low-risk patients, but is recommended for high-risk patients. This recommendation follows the consensus statement: Guidelines for Management of Incidental Pulmonary Nodules Detected on CT Images: From the Fleischner Society 2017; Radiology 2017; 284:228-243. 6. Moderate amount of retained stool in the colon. 7. Mild ascites. 8. Cardiomegaly with coronary artery calcifications and aortic atherosclerosis. 9. Enlarged prostate gland. 10. Remaining incidental findings as described above  Dr Mosetta Putt note:   Plan -Ultrasound-guided liver biopsy by IR as soon as possible -Will obtain genetic testing and tumor marker if the biopsy confirms metastatic pancreatic cancer -I will see him back after liver biopsy.  If  patient is interested in treatment, I will obtain CT chest to complete staging.    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   Hyperlipidemia    Hypertension     Past Surgical History:  Procedure Laterality Date   cardioversion x 2     CHOLECYSTECTOMY     HERNIA REPAIR     INGUINAL LYMPH NODE BIOPSY Left 03/20/2020   Procedure: LEFT INGUINAL LYMPH NODE BIOPSY;  Surgeon: [name], MD;  Location: [facility];  Service: General;  Laterality: Left;   LAMINECTOMY     SEPTOPLASTY     TONSILECTOMY, ADENOIDECTOMY, BILATERAL MYRINGOTOMY AND TUBES      Allergies: Crestor [rosuvastatin calcium], Lipitor [atorvastatin], and Plavix [clopidogrel bisulfate]  Medications: Prior to Admission medications   Medication Sig Start Date End Date Taking? Authorizing Provider  amLODipine (NORVASC) 5 MG tablet TAKE 1 TABLET (5 MG TOTAL) BY MOUTH DAILY. PLEASE KEEP UPCOMING APPT FOR MORE FUTURE REFILLS. 11/17/22   Quintella Reichert, MD  metoprolol tartrate (LOPRESSOR) 50 MG tablet TAKE 1 TABLET BY MOUTH 2 (TWO) TIMES DAILY. PT NEEDS TO KEEP APPOINTMENT FOR # 90 SUPPLY. 10/02/22   Quintella Reichert, MD  Omega-3 Fatty Acids (FISH OIL PO) Take 1,000 mg by mouth daily.     [provider]  warfarin (  COUMADIN) 5 MG tablet TAKE 1 TABLET BY MOUTH DAILY OR AS DIRECTED BY COUMADIN CLINIC 11/17/22   Quintella Reichert, MD     Family History  Problem Relation Age of Onset   CAD Father    CAD Sister    Diabetes Mellitus II Sister    CAD Brother    Diabetes  Mellitus II Brother     Social History   Socioeconomic History   Marital status: Married    Spouse name: Not on file   Number of children: 2   Years of education: Not on file   Highest education level: Not on file  Occupational History   Not on file  Tobacco Use   Smoking status: Never   Smokeless tobacco: Never  Vaping Use   Vaping Use: Never used  Substance and Sexual Activity   Alcohol use: No   Drug use: No   Sexual activity: Not on file  Other Topics Concern   Not on file  Social History Narrative   Not on file   Social Determinants of Health   Financial Resource Strain: Not on file  Food Insecurity: Not on file  Transportation Needs: Not on file  Physical Activity: Not on file  Stress: Not on file  Social Connections: Not on file    Review of Systems: A 12 point ROS discussed and pertinent positives are indicated in the HPI above.  All other systems are negative.  Review of Systems  Constitutional:  Positive for activity change, appetite change and unexpected weight change. Negative for fever.  Respiratory:  Negative for cough and shortness of breath.   Cardiovascular:  Negative for chest pain.  Gastrointestinal:  Positive for abdominal distention, abdominal pain, constipation and nausea.  Musculoskeletal:  Positive for back pain.  Psychiatric/Behavioral:  Negative for behavioral problems and confusion.     Vital Signs: BP (!) 149/85   Pulse 89   Temp 97.9 F (36.6 C) (Oral)   Resp 18   Ht 5' 10.5" (1.791 m)   Wt 170 lb (77.1 kg)   SpO2 98%   BMI 24.05 kg/m    Physical Exam Vitals reviewed.  HENT:     Mouth/Throat:     Mouth: Mucous membranes are moist.  Cardiovascular:     Rate and Rhythm: Normal rate and regular rhythm.     Heart sounds: Normal heart sounds.  Pulmonary:     Effort: Pulmonary effort is normal.     Breath sounds: Normal breath sounds.  Abdominal:     General: There is distension.     Palpations: Abdomen is soft.      Tenderness: There is abdominal tenderness.  Musculoskeletal:        General: Normal range of motion.  Skin:    General: Skin is warm.  Neurological:     Mental Status: He is alert and oriented to person, place, and time.  Psychiatric:        Behavior: Behavior normal.     Imaging: CT ABDOMEN PELVIS W CONTRAST  Result Date: 02/01/2023 CLINICAL DATA:  Abdominal pain with history of constipation. EXAM: CT ABDOMEN AND PELVIS WITH CONTRAST TECHNIQUE: Multidetector CT imaging of the abdomen and pelvis was performed using the standard protocol following bolus administration of intravenous contrast. RADIATION DOSE REDUCTION: This exam was performed according to the departmental dose-optimization program which includes automated exposure control, adjustment of the mA and/or kV according to patient size and/or use of iterative reconstruction technique. CONTRAST:  ISOVUE-300 IOPAMIDOL (ISOVUE-300)  INJECTION 61% COMPARISON:  12/06/2019, 08/22/2019. FINDINGS: Lower chest: The heart is enlarged. Mild atelectasis is present at the lung bases. There is a 5 mm nodule is present in the right middle lobe, axial image 5. There is a 6 mm nodule in the right lower lobe, axial image 17. Hepatobiliary: Multiple ill-defined hypodensities are present in the liver, the largest measuring 4.1 cm. No biliary ductal dilatation for patient's cholecystectomy status. Pancreas: There is a ill-defined hypodense mass in the tail of the pancreas measuring 3.0 x 1.8 cm. No pancreatic ductal dilatation or surrounding fat stranding. Spleen: Normal size. There is a vague hypodensity in the spleen measuring 2.3 cm,, likely present in 2020 and not significantly changed. Adrenals/Urinary Tract: The adrenal glands are within normal limits. The kidneys enhance symmetrically. Cysts are noted in the kidneys bilaterally. Hyperdense lesion is noted along the mid left kidney measuring 1.1 cm, likely hemorrhagic or proteinaceous cyst. No renal  calculus or hydronephrosis. Forty-five mm calculus is noted in the distal right ureter with mild dilatation of the pelvic ureter. No hydronephrosis bilaterally. No bladder wall thickening. Stomach/Bowel: Stomach is within normal limits. A small diverticulum is noted at the second portion of the duodenum. Appendix appears normal. There is thickening of the walls of the terminal ileum with scattered diverticula along the distal ileum. No free air or pneumatosis. Scattered diverticula are noted along the colon without evidence of diverticulitis. Moderate amount of retained stool in the colon. Vascular/Lymphatic: Aortic atherosclerosis. Multiple enlarged lymph nodes are present at the porta hepatis and gastrohepatic ligament. Reproductive: Prostate gland is markedly enlarged with a nodule of the protrudes into the base of the urinary bladder. Other: Mild ascites in all 4 quadrants. Musculoskeletal: Degenerative changes are present in the thoracolumbar spine. No acute or suspicious osseous abnormality. IMPRESSION: 1. Ill-defined mass in the tail of the pancreas, concerning for pancreatic adenocarcinoma. 2. Multiple scattered hypodensities in the liver with enlarged lymph nodes at the porta hepatis and gastrohepatic ligament, suggesting metastatic disease. 3. Thickening of the walls of the terminal ileum, possible infectious or inflammatory ileitis. 4. 5 mm calculus in the distal right ureter with dilatation of the pelvic ureter on the right. No hydronephrosis. 5. Pulmonary nodules in the right middle and lower lobes measuring up to 6 mm. Non-contrast chest CT at 6-12 months is recommended. If the nodule is stable at time of repeat CT, then future CT at 18-24 months (from today's scan) is considered optional for low-risk patients, but is recommended for high-risk patients. This recommendation follows the consensus statement: Guidelines for Management of Incidental Pulmonary Nodules Detected on CT Images: From the  Fleischner Society 2017; Radiology 2017; 284:228-243. 6. Moderate amount of retained stool in the colon. 7. Mild ascites. 8. Cardiomegaly with coronary artery calcifications and aortic atherosclerosis. 9. Enlarged prostate gland. 10. Remaining incidental findings as described above. Electronically Signed   By: Thornell Sartorius M.D.   On: 02/01/2023 03:42    Labs:  CBC: Recent Labs    02/10/23 1106  WBC 12.0*  HGB 13.8  HCT 40.9  PLT 185    COAGS: Recent Labs    08/01/22 0801 09/25/22 0816 11/20/22 0814 01/15/23 0806  INR 2.7 2.8 2.6 2.0    BMP: Recent Labs    02/10/23 1106  NA 136  K 5.6*  CL 97  CO2 23  GLUCOSE 125*  BUN 14  CALCIUM 10.2  CREATININE 0.78    LIVER FUNCTION TESTS: No results for input(s): "BILITOT", "AST", "ALT", "ALKPHOS", "PROT", "  ALBUMIN" in the last 8760 hours.  TUMOR MARKERS: No results for input(s): "AFPTM", "CEA", "CA199", "CHROMGRNA" in the last 8760 hours.  Assessment and Plan:  Newly discovered pancreatic mass and liver lesions Lung nodules Wt loss; and abd pain; back pain Oncology requesting liver lesion biopsy Risks and benefits of liver lesion biopsy and possible paracentesis was discussed with the patient and/or patient's family including, but not limited to bleeding, infection, damage to adjacent structures or low yield requiring additional tests.  All of the questions were answered and there is agreement to proceed.  Consent signed and in chart.  Thank you for this interesting consult.  I greatly enjoyed meeting Meiko Stranahan and look forward to participating in their care.  A copy of this report was sent to the requesting provider on this date.  Electronically Signed: Robet Leu, PA-C 02/17/2023, 7:39 AM   I spent a total of  30 Minutesliver lesion biopsy   in face to face in clinical consultation, greater than 50% of which was counseling/coordinating care for liver lesion biopsy and possible paracentesis

## 2023-02-17 NOTE — Sedation Documentation (Signed)
Patient is resting comfortably. 

## 2023-02-17 NOTE — Procedures (Signed)
Vascular and Interventional Radiology Procedure Note  Patient: Joal Eakle DOB: 1940-11-30 Medical Record Number: [id number] Note Date/Time: 02/17/23 9:07 AM   Performing Physician: Roanna Banning, MD Assistant(s): None  Diagnosis: Pancreas mass with liver masses  Procedure: LIVER MASS BIOPSY  Anesthesia: Conscious Sedation Complications: None Estimated Blood Loss: Minimal Specimens: Sent for Pathology  Findings:  Successful Ultrasound-guided biopsy of liver mass. A total of 3 samples were obtained. Hemostasis of the tract was achieved using Gelfoam Slurry Embolization.  Plan: Bed rest for 2 hours.  See detailed procedure note with images in PACS. The patient tolerated the procedure well without incident or complication and was returned to Recovery in stable condition.    Roanna Banning, MD [facility] Radiology   Pager. [phone] Clinic. [phone]

## 2023-02-19 ENCOUNTER — Ambulatory Visit: Payer: Medicare Other

## 2023-02-19 ENCOUNTER — Other Ambulatory Visit: Payer: Self-pay | Admitting: Genetic Counselor

## 2023-02-19 DIAGNOSIS — Z1379 Encounter for other screening for genetic and chromosomal anomalies: Secondary | ICD-10-CM

## 2023-02-19 LAB — SURGICAL PATHOLOGY

## 2023-02-20 ENCOUNTER — Telehealth: Payer: Self-pay

## 2023-02-20 ENCOUNTER — Other Ambulatory Visit: Payer: Self-pay

## 2023-02-20 ENCOUNTER — Inpatient Hospital Stay: Payer: Medicare Other

## 2023-02-20 ENCOUNTER — Encounter: Payer: Self-pay | Admitting: Hematology

## 2023-02-20 ENCOUNTER — Inpatient Hospital Stay: Payer: Medicare Other | Attending: Hematology | Admitting: Hematology

## 2023-02-20 VITALS — BP 144/87 | HR 86 | Temp 97.5°F | Resp 18 | Ht 70.5 in | Wt 172.7 lb

## 2023-02-20 DIAGNOSIS — C252 Malignant neoplasm of tail of pancreas: Secondary | ICD-10-CM | POA: Diagnosis not present

## 2023-02-20 DIAGNOSIS — C259 Malignant neoplasm of pancreas, unspecified: Secondary | ICD-10-CM

## 2023-02-20 DIAGNOSIS — K8689 Other specified diseases of pancreas: Secondary | ICD-10-CM

## 2023-02-20 DIAGNOSIS — Z1379 Encounter for other screening for genetic and chromosomal anomalies: Secondary | ICD-10-CM

## 2023-02-20 DIAGNOSIS — C787 Secondary malignant neoplasm of liver and intrahepatic bile duct: Secondary | ICD-10-CM

## 2023-02-20 DIAGNOSIS — Z8507 Personal history of malignant neoplasm of pancreas: Secondary | ICD-10-CM | POA: Diagnosis not present

## 2023-02-20 LAB — CBC WITH DIFFERENTIAL (CANCER CENTER ONLY)
Abs Immature Granulocytes: 0.11 10*3/uL — ABNORMAL HIGH (ref 0.00–0.07)
Basophils Absolute: 0 10*3/uL (ref 0.0–0.1)
Basophils Relative: 0 %
Eosinophils Absolute: 0 10*3/uL (ref 0.0–0.5)
Eosinophils Relative: 0 %
HCT: 40.6 % (ref 39.0–52.0)
Hemoglobin: 13.5 g/dL (ref 13.0–17.0)
Immature Granulocytes: 1 %
Lymphocytes Relative: 8 %
Lymphs Abs: 1.2 10*3/uL (ref 0.7–4.0)
MCH: 29.4 pg (ref 26.0–34.0)
MCHC: 33.3 g/dL (ref 30.0–36.0)
MCV: 88.5 fL (ref 80.0–100.0)
Monocytes Absolute: 1.2 10*3/uL — ABNORMAL HIGH (ref 0.1–1.0)
Monocytes Relative: 8 %
Neutro Abs: 12.5 10*3/uL — ABNORMAL HIGH (ref 1.7–7.7)
Neutrophils Relative %: 83 %
Platelet Count: 222 10*3/uL (ref 150–400)
RBC: 4.59 MIL/uL (ref 4.22–5.81)
RDW: 15.7 % — ABNORMAL HIGH (ref 11.5–15.5)
WBC Count: 15.1 10*3/uL — ABNORMAL HIGH (ref 4.0–10.5)
nRBC: 0 % (ref 0.0–0.2)

## 2023-02-20 LAB — CMP (CANCER CENTER ONLY)
ALT: 56 U/L — ABNORMAL HIGH (ref 0–44)
AST: 88 U/L — ABNORMAL HIGH (ref 15–41)
Albumin: 3.7 g/dL (ref 3.5–5.0)
Alkaline Phosphatase: 393 U/L — ABNORMAL HIGH (ref 38–126)
Anion gap: 10 (ref 5–15)
BUN: 26 mg/dL — ABNORMAL HIGH (ref 8–23)
CO2: 26 mmol/L (ref 22–32)
Calcium: 9.3 mg/dL (ref 8.9–10.3)
Chloride: 92 mmol/L — ABNORMAL LOW (ref 98–111)
Creatinine: 0.87 mg/dL (ref 0.61–1.24)
GFR, Estimated: >60 mL/min (ref >60)
Glucose, Bld: 116 mg/dL — ABNORMAL HIGH (ref 70–99)
Potassium: 4.8 mmol/L (ref 3.5–5.1)
Sodium: 128 mmol/L — ABNORMAL LOW (ref 135–145)
Total Bilirubin: 2 mg/dL — ABNORMAL HIGH (ref 0.3–1.2)
Total Protein: 7 g/dL (ref 6.5–8.1)

## 2023-02-20 LAB — GENETIC SCREENING ORDER

## 2023-02-20 MED ORDER — ONDANSETRON HCL 8 MG PO TABS: 8.0000 mg | ORAL_TABLET | Freq: Three times a day (TID) | ORAL | 2 refills | Status: DC | PRN

## 2023-02-20 MED ORDER — TRAMADOL HCL 50 MG PO TABS: 50.0000 mg | ORAL_TABLET | Freq: Four times a day (QID) | ORAL | 0 refills | Status: DC | PRN

## 2023-02-20 MED ORDER — PROCHLORPERAZINE MALEATE 10 MG PO TABS: 10.0000 mg | ORAL_TABLET | Freq: Four times a day (QID) | ORAL | 2 refills | Status: DC | PRN

## 2023-02-20 NOTE — Telephone Encounter (Signed)
Called [facility] as per Dr. Mosetta Putt spoke with Revonda Standard she stated she would call family to get the process set up. Gave her patients name and dob.

## 2023-02-20 NOTE — Progress Notes (Signed)
[facility]   [phone] [phone]   Clinic Follow up Note   Patient Care Team: [name], MD as PCP - General (Family Medicine) Quintella Reichert, MD as PCP - Cardiology (Cardiology) Quintella Reichert, MD as Consulting Physician (Cardiology)  Date of Service:  02/20/2023  CHIEF COMPLAINT: f/u of Pancreatic mass   CURRENT THERAPY:    ASSESSMENT:  Francisco Olsen is a 82 y.o. male with   Pancreatic cancer metastasized to liver [facility]) -diagnosed on 02/17/2023 by liver biopsy -He presented with epigastric pain, anorexia and taste change.  CT scan showed a 3 cm mass in the tail of pancreas, with diffuse liver metastasis. 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 -I discussed the incurable nature of his metastatic pancreatic cancer, and overall poor prognosis.  The median survival of untreated metastatic pancreatic cancer is 3 to 4 months, I discussed the benefit of chemotherapy, and other systemic treatment. -I discussed option of NexGen or sequencing Tempus, to see if he is a candidate for immunotherapy or targeted therapy. -I recommend genetic testing to see if he has targetable mutations such as BRCA1 or BRCA2 -I discussed systemic treatment options, including single agent gemcitabine, due to his advanced age.  If he tolerates well, we can add on low-dose Abraxane every 2 weeks.  I discussed that chemotherapy may prolong his life by few months to a year -After lengthy discussion about above, patient made very clear that he does not want to prolong his life, and does not want any chemotherapy.  This was supported by his wife. -I recommend him to consider home hospice care.  They are familiar with [facility] [place], immediate donation to them in the past to support him.  I will refer him to them. -Patient also wants to donate  his body if it can be used after he dies. Will let hospice know.  -We reviewed symptom management with patient, I called in Compazine and potential for nausea, and tramadol for pain.     PLAN: -I discuss Liver biopsy -I prescribe Odansetron and Compazine for nausea. -I prescribe Tramadol for pain -discuss Hospice, will refer him to [facility] [place] -Pt agree to Hospice Care  SUMMARY OF ONCOLOGIC HISTORY: Oncology History Overview Note   Cancer Staging  Pancreatic cancer metastasized to liver [facility]) Staging form: Exocrine Pancreas, AJCC 8th Edition - Clinical stage from 02/17/2023: Stage IV (cT2, cN1, pM1) - Signed by Malachy Mood, MD on 02/20/2023 Stage prefix: Initial diagnosis     Pancreatic cancer metastasized to liver [facility])  02/17/2023 Cancer Staging   Staging form: Exocrine Pancreas, AJCC 8th Edition - Clinical stage from 02/17/2023: Stage IV (cT2, cN1, pM1) - Signed by Malachy Mood, MD on 02/20/2023 Stage prefix: Initial diagnosis   02/20/2023 Initial Diagnosis   Pancreatic cancer metastasized to liver [facility])      INTERVAL HISTORY:  Francisco Olsen is here for a follow up of Pancreatic mass. He was last seen by me on 02/06/2023. He presents to the clinic accompanied by wife. Pt report that he can't keep anything down. Pt state that he had loose stool yesterday., but most of the time he is constipated. Pt state when he eats the phlegm gets in his throat.  All other systems were reviewed with the patient and are negative.  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  ALLERGIES:  is allergic to crestor [rosuvastatin calcium], lipitor [atorvastatin], and plavix [clopidogrel bisulfate].  MEDICATIONS:  Current Outpatient Medications  Medication Sig Dispense Refill   ondansetron (ZOFRAN) 8 MG tablet Take 1 tablet (8 mg total) by mouth every 8 (eight) hours as needed for nausea or vomiting. 30 tablet 2   prochlorperazine (COMPAZINE) 10 MG tablet Take 1 tablet (10 mg total) by mouth every 6 (six) hours as needed for nausea or vomiting. 30 tablet 2   traMADol (ULTRAM) 50 MG tablet Take 1-2 tablets (50-100 mg total) by mouth every 6 (six) hours as needed. 30 tablet 0   amLODipine (NORVASC) 5 MG tablet TAKE 1 TABLET (5 MG TOTAL) BY MOUTH DAILY. PLEASE KEEP UPCOMING APPT FOR MORE FUTURE REFILLS. 90 tablet 3   metoprolol tartrate (LOPRESSOR) 50 MG tablet TAKE 1 TABLET BY MOUTH 2 (TWO) TIMES DAILY. PT NEEDS TO KEEP APPOINTMENT FOR # 90 SUPPLY. 180 tablet 3   Omega-3 Fatty Acids (FISH OIL PO) Take 1,000 mg by mouth daily.      warfarin (COUMADIN) 5 MG tablet TAKE 1 TABLET BY MOUTH DAILY OR AS DIRECTED BY COUMADIN CLINIC 100 tablet 0   No current facility-administered medications for this visit.    PHYSICAL EXAMINATION: ECOG PERFORMANCE STATUS: 2 - Symptomatic, <50% confined to bed  Vitals:   02/20/23 1356  BP: (!) 144/87  Pulse: 86  Resp: 18  Temp: (!) 97.5 F (36.4 C)  SpO2: 99%   Wt Readings from Last 3 Encounters:  02/20/23 172 lb 11.2 oz (78.3 kg)  02/17/23 170 lb  (77.1 kg)  02/06/23 177 lb 3.2 oz (80.4 kg)     GENERAL:alert, no distress and comfortable SKIN: skin color normal, no rashes or significant lesions EYES: normal, Conjunctiva are pink and non-injected, sclera clear  NEURO: alert & oriented x 3 with fluent speech   LABORATORY DATA:  I have reviewed the data as listed    Latest Ref Rng & Units 02/20/2023    1:24 PM 02/17/2023    7:31 AM 02/10/2023   11:06 AM  CBC  WBC 4.0 - 10.5 K/uL 15.1  11.9  12.0   Hemoglobin 13.0 - 17.0 g/dL 16.1  09.6  04.5   Hematocrit 39.0 - 52.0 % 40.6  41.1  40.9   Platelets 150 - 400 K/uL 222  220  185         Latest Ref Rng & Units 02/20/2023    1:24 PM 02/10/2023   11:06 AM 09/06/2020    9:28 AM  CMP  Glucose 70 - 99 mg/dL 323  557  322   BUN 8 - 23 mg/dL 26  14  16    Creatinine 0.61 - 1.24 mg/dL 0.25  4.27  0.62   Sodium 135 - 145 mmol/L 128  136  141   Potassium 3.5 - 5.1 mmol/L 4.8  5.6  3.7   Chloride 98 - 111 mmol/L 92  97  102   CO2 22 - 32 mmol/L 26  23  25    Calcium 8.9 - 10.3 mg/dL 9.3  37.6  9.4   Total Protein 6.5 - 8.1 g/dL 7.0     Total Bilirubin 0.3 - 1.2 mg/dL 2.0     Alkaline Phos 38 - 126 U/L 393     AST 15 - 41 U/L 88     ALT 0 - 44 U/L 56         RADIOGRAPHIC STUDIES: I have personally reviewed the radiological images as listed and agreed with the findings in the report. No results found.    No orders of the defined types were placed in this encounter.  All questions were answered. The patient knows to call the clinic with any problems, questions or concerns. No barriers to learning was detected. The total time spent in the appointment was 40 minutes.     Malachy Mood, MD 02/20/2023   Carolin Coy, CMA, am acting as scribe for Malachy Mood, MD.   I have reviewed the above documentation for accuracy and completeness, and I agree with the above.

## 2023-02-20 NOTE — Assessment & Plan Note (Signed)
-  diagnosed on 02/17/2023 by liver biopsy -He presented with epigastric pain, anorexia and taste change.  CT scan showed a 3 cm mass in the tail of pancreas, with diffuse liver metastasis. 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 -Will request MMR and Tempus, to see if he is a candidate for immunotherapy or targeted therapy. -We also obtained a genetic test today, to see if he has targetable mutations such as BRCA1 or BRCA2 -I discussed systemic treatment options, including single agent gemcitabine, due to his advanced age.  If he tolerates well, we can add on low-dose Abraxane every 2 weeks.

## 2023-02-21 NOTE — Progress Notes (Unsigned)
   Referral received from Dr. Latanya Maudlin office. Discussed with the wife and pt hospice services and the interdisciplinary team approach. They do confirm wishes of not wanting to pursue with any kind of aggressive chemotherapy and are interested in hospice services. Pt was reviewed with our MD and approved for hospice care at home. We offered visit on Sunday but they preferred Monday morning 1000am.   He has no equipment needs at this time. He reports having walker, cane, BSC and shower bench in home.   Norm Parcel RN [phone]

## 2023-02-23 ENCOUNTER — Other Ambulatory Visit: Payer: Self-pay

## 2023-02-23 ENCOUNTER — Telehealth: Payer: Self-pay | Admitting: Genetic Counselor

## 2023-02-23 LAB — CANCER ANTIGEN 19-9: CA 19-9: 189243 U/mL — ABNORMAL HIGH (ref 0–35)

## 2023-02-23 NOTE — Telephone Encounter (Signed)
[facility] CancerNext-Expanded Panel POC testing was ordered.  Lalla Brothers, MS, [facility] Genetic Counselor [email or web address] (P) [phone]

## 2023-02-25 ENCOUNTER — Ambulatory Visit (HOSPITAL_COMMUNITY): Payer: Medicare Other

## 2023-02-26 ENCOUNTER — Emergency Department (HOSPITAL_COMMUNITY)

## 2023-02-26 ENCOUNTER — Inpatient Hospital Stay (HOSPITAL_COMMUNITY)
Admission: EM | Admit: 2023-02-26 | Discharge: 2023-03-21 | DRG: 951 | Disposition: E | Attending: Internal Medicine | Admitting: Internal Medicine

## 2023-02-26 ENCOUNTER — Encounter (HOSPITAL_COMMUNITY): Payer: Self-pay

## 2023-02-26 ENCOUNTER — Other Ambulatory Visit: Payer: Self-pay

## 2023-02-26 DIAGNOSIS — E871 Hypo-osmolality and hyponatremia: Secondary | ICD-10-CM | POA: Diagnosis not present

## 2023-02-26 DIAGNOSIS — R404 Transient alteration of awareness: Secondary | ICD-10-CM | POA: Diagnosis not present

## 2023-02-26 DIAGNOSIS — R188 Other ascites: Secondary | ICD-10-CM | POA: Diagnosis not present

## 2023-02-26 DIAGNOSIS — R918 Other nonspecific abnormal finding of lung field: Secondary | ICD-10-CM | POA: Diagnosis present

## 2023-02-26 DIAGNOSIS — Z6824 Body mass index (BMI) 24.0-24.9, adult: Secondary | ICD-10-CM

## 2023-02-26 DIAGNOSIS — I6523 Occlusion and stenosis of bilateral carotid arteries: Secondary | ICD-10-CM | POA: Diagnosis not present

## 2023-02-26 DIAGNOSIS — R7989 Other specified abnormal findings of blood chemistry: Secondary | ICD-10-CM

## 2023-02-26 DIAGNOSIS — G8191 Hemiplegia, unspecified affecting right dominant side: Secondary | ICD-10-CM | POA: Diagnosis not present

## 2023-02-26 DIAGNOSIS — R29810 Facial weakness: Secondary | ICD-10-CM | POA: Diagnosis not present

## 2023-02-26 DIAGNOSIS — C259 Malignant neoplasm of pancreas, unspecified: Secondary | ICD-10-CM | POA: Diagnosis present

## 2023-02-26 DIAGNOSIS — Z888 Allergy status to other drugs, medicaments and biological substances status: Secondary | ICD-10-CM | POA: Diagnosis not present

## 2023-02-26 DIAGNOSIS — I63512 Cerebral infarction due to unspecified occlusion or stenosis of left middle cerebral artery: Secondary | ICD-10-CM | POA: Diagnosis present

## 2023-02-26 DIAGNOSIS — Z8249 Family history of ischemic heart disease and other diseases of the circulatory system: Secondary | ICD-10-CM

## 2023-02-26 DIAGNOSIS — G8101 Flaccid hemiplegia affecting right dominant side: Secondary | ICD-10-CM | POA: Diagnosis not present

## 2023-02-26 DIAGNOSIS — I4821 Permanent atrial fibrillation: Secondary | ICD-10-CM | POA: Diagnosis not present

## 2023-02-26 DIAGNOSIS — E875 Hyperkalemia: Secondary | ICD-10-CM | POA: Diagnosis not present

## 2023-02-26 DIAGNOSIS — Z66 Do not resuscitate: Secondary | ICD-10-CM | POA: Diagnosis present

## 2023-02-26 DIAGNOSIS — I119 Hypertensive heart disease without heart failure: Secondary | ICD-10-CM | POA: Diagnosis present

## 2023-02-26 DIAGNOSIS — I700 Atherosclerosis of aorta: Secondary | ICD-10-CM | POA: Diagnosis not present

## 2023-02-26 DIAGNOSIS — Z515 Encounter for palliative care: Secondary | ICD-10-CM | POA: Diagnosis not present

## 2023-02-26 DIAGNOSIS — R4701 Aphasia: Secondary | ICD-10-CM | POA: Diagnosis present

## 2023-02-26 DIAGNOSIS — I2510 Atherosclerotic heart disease of native coronary artery without angina pectoris: Secondary | ICD-10-CM | POA: Diagnosis not present

## 2023-02-26 DIAGNOSIS — R29729 NIHSS score 29: Secondary | ICD-10-CM | POA: Diagnosis present

## 2023-02-26 DIAGNOSIS — D72829 Elevated white blood cell count, unspecified: Secondary | ICD-10-CM | POA: Diagnosis not present

## 2023-02-26 DIAGNOSIS — R64 Cachexia: Secondary | ICD-10-CM | POA: Diagnosis not present

## 2023-02-26 DIAGNOSIS — Z79899 Other long term (current) drug therapy: Secondary | ICD-10-CM

## 2023-02-26 DIAGNOSIS — I639 Cerebral infarction, unspecified: Secondary | ICD-10-CM | POA: Diagnosis not present

## 2023-02-26 DIAGNOSIS — Z7901 Long term (current) use of anticoagulants: Secondary | ICD-10-CM

## 2023-02-26 DIAGNOSIS — E785 Hyperlipidemia, unspecified: Secondary | ICD-10-CM | POA: Diagnosis not present

## 2023-02-26 DIAGNOSIS — K219 Gastro-esophageal reflux disease without esophagitis: Secondary | ICD-10-CM | POA: Diagnosis present

## 2023-02-26 DIAGNOSIS — C787 Secondary malignant neoplasm of liver and intrahepatic bile duct: Secondary | ICD-10-CM | POA: Diagnosis not present

## 2023-02-26 DIAGNOSIS — Z7189 Other specified counseling: Secondary | ICD-10-CM | POA: Diagnosis not present

## 2023-02-26 DIAGNOSIS — R29818 Other symptoms and signs involving the nervous system: Secondary | ICD-10-CM | POA: Diagnosis not present

## 2023-02-26 DIAGNOSIS — E119 Type 2 diabetes mellitus without complications: Secondary | ICD-10-CM | POA: Diagnosis present

## 2023-02-26 DIAGNOSIS — R531 Weakness: Secondary | ICD-10-CM | POA: Diagnosis not present

## 2023-02-26 DIAGNOSIS — Z9049 Acquired absence of other specified parts of digestive tract: Secondary | ICD-10-CM

## 2023-02-26 DIAGNOSIS — I10 Essential (primary) hypertension: Secondary | ICD-10-CM | POA: Diagnosis present

## 2023-02-26 DIAGNOSIS — Z743 Need for continuous supervision: Secondary | ICD-10-CM | POA: Diagnosis not present

## 2023-02-26 DIAGNOSIS — N400 Enlarged prostate without lower urinary tract symptoms: Secondary | ICD-10-CM | POA: Diagnosis present

## 2023-02-26 DIAGNOSIS — N179 Acute kidney failure, unspecified: Secondary | ICD-10-CM

## 2023-02-26 DIAGNOSIS — Z833 Family history of diabetes mellitus: Secondary | ICD-10-CM

## 2023-02-26 HISTORY — DX: Malignant (primary) neoplasm, unspecified: C80.1

## 2023-02-26 LAB — I-STAT CHEM 8, ED
BUN: 47 mg/dL — ABNORMAL HIGH (ref 8–23)
Calcium, Ion: 1.07 mmol/L — ABNORMAL LOW (ref 1.15–1.40)
Chloride: 90 mmol/L — ABNORMAL LOW (ref 98–111)
Creatinine, Ser: 1.5 mg/dL — ABNORMAL HIGH (ref 0.61–1.24)
Glucose, Bld: 135 mg/dL — ABNORMAL HIGH (ref 70–99)
HCT: 42 % (ref 39.0–52.0)
Hemoglobin: 14.3 g/dL (ref 13.0–17.0)
Potassium: 5.6 mmol/L — ABNORMAL HIGH (ref 3.5–5.1)
Sodium: 118 mmol/L — CL (ref 135–145)
TCO2: 23 mmol/L (ref 22–32)

## 2023-02-26 LAB — COMPREHENSIVE METABOLIC PANEL
ALT: 106 U/L — ABNORMAL HIGH (ref 0–44)
AST: 177 U/L — ABNORMAL HIGH (ref 15–41)
Albumin: 2.5 g/dL — ABNORMAL LOW (ref 3.5–5.0)
Alkaline Phosphatase: 612 U/L — ABNORMAL HIGH (ref 38–126)
Anion gap: 11 (ref 5–15)
BUN: 49 mg/dL — ABNORMAL HIGH (ref 8–23)
CO2: 20 mmol/L — ABNORMAL LOW (ref 22–32)
Calcium: 8.8 mg/dL — ABNORMAL LOW (ref 8.9–10.3)
Chloride: 87 mmol/L — ABNORMAL LOW (ref 98–111)
Creatinine, Ser: 1.33 mg/dL — ABNORMAL HIGH (ref 0.61–1.24)
GFR, Estimated: 54 mL/min — ABNORMAL LOW (ref >60)
Glucose, Bld: 136 mg/dL — ABNORMAL HIGH (ref 70–99)
Potassium: 5.6 mmol/L — ABNORMAL HIGH (ref 3.5–5.1)
Sodium: 120 mmol/L — ABNORMAL LOW (ref 135–145)
Total Bilirubin: 1.9 mg/dL — ABNORMAL HIGH (ref 0.3–1.2)
Total Protein: 5.8 g/dL — ABNORMAL LOW (ref 6.5–8.1)

## 2023-02-26 LAB — CBG MONITORING, ED: Glucose-Capillary: 152 mg/dL — ABNORMAL HIGH (ref 70–99)

## 2023-02-26 LAB — CBC
HCT: 38.3 % — ABNORMAL LOW (ref 39.0–52.0)
Hemoglobin: 13.3 g/dL (ref 13.0–17.0)
MCH: 29.7 pg (ref 26.0–34.0)
MCHC: 34.7 g/dL (ref 30.0–36.0)
MCV: 85.5 fL (ref 80.0–100.0)
Platelets: 224 10*3/uL (ref 150–400)
RBC: 4.48 MIL/uL (ref 4.22–5.81)
RDW: 15.2 % (ref 11.5–15.5)
WBC: 16.7 10*3/uL — ABNORMAL HIGH (ref 4.0–10.5)
nRBC: 0 % (ref 0.0–0.2)

## 2023-02-26 LAB — DIFFERENTIAL
Abs Immature Granulocytes: 0.18 10*3/uL — ABNORMAL HIGH (ref 0.00–0.07)
Basophils Absolute: 0 10*3/uL (ref 0.0–0.1)
Basophils Relative: 0 %
Eosinophils Absolute: 0 10*3/uL (ref 0.0–0.5)
Eosinophils Relative: 0 %
Immature Granulocytes: 1 %
Lymphocytes Relative: 8 %
Lymphs Abs: 1.3 10*3/uL (ref 0.7–4.0)
Monocytes Absolute: 1.5 10*3/uL — ABNORMAL HIGH (ref 0.1–1.0)
Monocytes Relative: 9 %
Neutro Abs: 13.7 10*3/uL — ABNORMAL HIGH (ref 1.7–7.7)
Neutrophils Relative %: 82 %

## 2023-02-26 LAB — PROTIME-INR
INR: 2 — ABNORMAL HIGH (ref 0.8–1.2)
Prothrombin Time: 22.6 seconds — ABNORMAL HIGH (ref 11.4–15.2)

## 2023-02-26 LAB — ETHANOL: Alcohol, Ethyl (B): <10 mg/dL (ref <10)

## 2023-02-26 LAB — APTT: aPTT: 38 seconds — ABNORMAL HIGH (ref 24–36)

## 2023-02-26 MED ORDER — SODIUM CHLORIDE 0.9 % IV SOLN: 12.5000 mg | Freq: Four times a day (QID) | INTRAVENOUS | Status: DC | PRN

## 2023-02-26 MED ORDER — HALOPERIDOL LACTATE 2 MG/ML PO CONC: 0.5000 mg | ORAL | Status: DC | PRN

## 2023-02-26 MED ORDER — IOHEXOL 350 MG/ML SOLN
75.0000 mL | Freq: Once | INTRAVENOUS | Status: AC | PRN
  Administered 2023-02-26: 75 mL via INTRAVENOUS

## 2023-02-26 MED ORDER — LORAZEPAM 2 MG/ML IJ SOLN: 1.0000 mg | INTRAMUSCULAR | Status: DC | PRN

## 2023-02-26 MED ORDER — HALOPERIDOL LACTATE 5 MG/ML IJ SOLN: 0.5000 mg | INTRAMUSCULAR | Status: DC | PRN

## 2023-02-26 MED ORDER — LORAZEPAM 2 MG/ML PO CONC: 1.0000 mg | ORAL | Status: DC | PRN

## 2023-02-26 MED ORDER — ACETAMINOPHEN 650 MG RE SUPP: 650.0000 mg | Freq: Four times a day (QID) | RECTAL | Status: DC | PRN

## 2023-02-26 MED ORDER — HYDROMORPHONE HCL 1 MG/ML IJ SOLN
0.5000 mg | INTRAMUSCULAR | Status: DC | PRN
  Administered 2023-02-27: 0.5 mg via INTRAVENOUS
  Filled 2023-02-26: qty 0.5

## 2023-02-26 MED ORDER — POLYVINYL ALCOHOL 1.4 % OP SOLN: 1.0000 [drp] | Freq: Four times a day (QID) | OPHTHALMIC | Status: DC | PRN

## 2023-02-26 MED ORDER — GLYCOPYRROLATE 0.2 MG/ML IJ SOLN: 0.2000 mg | INTRAMUSCULAR | Status: DC | PRN

## 2023-02-26 MED ORDER — ONDANSETRON HCL 4 MG/2ML IJ SOLN: 4.0000 mg | Freq: Four times a day (QID) | INTRAMUSCULAR | Status: DC | PRN

## 2023-02-26 MED ORDER — HALOPERIDOL 1 MG PO TABS: 0.5000 mg | ORAL_TABLET | ORAL | Status: DC | PRN

## 2023-02-26 MED ORDER — GLYCOPYRROLATE 0.2 MG/ML IJ SOLN
0.2000 mg | INTRAMUSCULAR | Status: DC | PRN
  Administered 2023-02-27 (×2): 0.2 mg via INTRAVENOUS
  Filled 2023-02-26 (×3): qty 1

## 2023-02-26 MED ORDER — ACETAMINOPHEN 325 MG PO TABS: 650.0000 mg | ORAL_TABLET | Freq: Four times a day (QID) | ORAL | Status: DC | PRN

## 2023-02-26 MED ORDER — LORAZEPAM 1 MG PO TABS: 1.0000 mg | ORAL_TABLET | ORAL | Status: DC | PRN

## 2023-02-26 MED ORDER — GLYCOPYRROLATE 1 MG PO TABS: 1.0000 mg | ORAL_TABLET | ORAL | Status: DC | PRN

## 2023-02-26 MED ORDER — SODIUM CHLORIDE 0.9 % IV SOLN: Freq: Once | INTRAVENOUS | Status: AC

## 2023-02-26 MED ORDER — BIOTENE DRY MOUTH MT LIQD: 15.0000 mL | OROMUCOSAL | Status: DC | PRN

## 2023-02-26 MED ORDER — ONDANSETRON 4 MG PO TBDP: 4.0000 mg | ORAL_TABLET | Freq: Four times a day (QID) | ORAL | Status: DC | PRN

## 2023-02-26 NOTE — ED Provider Notes (Signed)
[place] EMERGENCY DEPARTMENT AT [facility] Provider Note   CSN: [id number] Arrival date & time: 03/16/2023  1742  An emergency department physician performed an initial assessment on this suspected stroke patient at 1743.  History  Chief Complaint  Patient presents with   Code Stroke    Francisco Olsen is a 82 y.o. male.  HPI 82 year old male with history of metastatic pancreatic cancer presenting as code stroke.  Per EMS report patient was found short time ago at his house with left-sided gaze deviation and right-sided weakness.  On arrival he is unable to participate in history.     Home Medications Prior to Admission medications   Medication Sig Start Date End Date Taking? Authorizing Provider  amLODipine (NORVASC) 5 MG tablet TAKE 1 TABLET (5 MG TOTAL) BY MOUTH DAILY. PLEASE KEEP UPCOMING APPT FOR MORE FUTURE REFILLS. 11/17/22  Yes Turner, Cornelious Bryant, MD  metoprolol tartrate (LOPRESSOR) 50 MG tablet TAKE 1 TABLET BY MOUTH 2 (TWO) TIMES DAILY. PT NEEDS TO KEEP APPOINTMENT FOR # 90 SUPPLY. Patient taking differently: Take 50 mg by mouth 2 (two) times daily. 10/02/22  Yes Turner, Cornelious Bryant, MD  Omega-3 Fatty Acids (FISH OIL PO) Take 1,000 mg by mouth daily.    Yes [provider]  ondansetron (ZOFRAN) 8 MG tablet Take 1 tablet (8 mg total) by mouth every 8 (eight) hours as needed for nausea or vomiting. 02/20/23  Yes Malachy Mood, MD  prochlorperazine (COMPAZINE) 10 MG tablet Take 1 tablet (10 mg total) by mouth every 6 (six) hours as needed for nausea or vomiting. 02/20/23  Yes Malachy Mood, MD  traMADol (ULTRAM) 50 MG tablet Take 1-2 tablets (50-100 mg total) by mouth every 6 (six) hours as needed. 02/20/23  Yes Malachy Mood, MD  warfarin (COUMADIN) 5 MG tablet TAKE 1 TABLET BY MOUTH DAILY OR AS DIRECTED BY COUMADIN CLINIC Patient taking differently: Take 5 mg by mouth daily. TAKE 1 TABLET BY MOUTH DAILY OR AS DIRECTED BY COUMADIN CLINIC 11/17/22  Yes Turner, Cornelious Bryant, MD       Allergies    Crestor [rosuvastatin calcium], Lipitor [atorvastatin], and Plavix [clopidogrel bisulfate]    Review of Systems   Review of Systems  Unable to perform ROS: Mental status change    Physical Exam Updated Vital Signs BP 134/84   Pulse 86   Temp 98.2 F (36.8 C) (Oral)   Resp 17   Ht 5\' 10"  (1.778 m)   Wt 79.2 kg   SpO2 92%   BMI 25.05 kg/m  Physical Exam Vitals and nursing note reviewed.  Constitutional:      Appearance: He is well-developed. He is ill-appearing.     Comments: Awake, obvious gaze deviation and hemiparesis  HENT:     Head: Normocephalic and atraumatic.  Eyes:     Conjunctiva/sclera: Conjunctivae normal.     Comments: Left-sided gaze deviation  Cardiovascular:     Rate and Rhythm: Normal rate and regular rhythm.     Heart sounds: No murmur heard. Pulmonary:     Effort: Pulmonary effort is normal. No respiratory distress.     Breath sounds: Normal breath sounds.  Abdominal:     Palpations: Abdomen is soft.     Tenderness: There is no abdominal tenderness.  Musculoskeletal:        General: No swelling.     Cervical back: Neck supple.  Skin:    General: Skin is warm and dry.     Capillary Refill: Capillary refill  takes less than 2 seconds.  Neurological:     Comments: Right-sided hemiparesis.  Left-sided able to move and sustain antigravity.  Eyes do not cross midline with left-sided gaze deviation.  Right-sided neglect.  Aphasic.     ED Results / Procedures / Treatments   Labs (all labs ordered are listed, but only abnormal results are displayed) Labs Reviewed  PROTIME-INR - Abnormal; Notable for the following components:      Result Value   Prothrombin Time 22.6 (*)    INR 2.0 (*)    All other components within normal limits  APTT - Abnormal; Notable for the following components:   aPTT 38 (*)    All other components within normal limits  CBC - Abnormal; Notable for the following components:   WBC 16.7 (*)    HCT 38.3 (*)     All other components within normal limits  DIFFERENTIAL - Abnormal; Notable for the following components:   Neutro Abs 13.7 (*)    Monocytes Absolute 1.5 (*)    Abs Immature Granulocytes 0.18 (*)    All other components within normal limits  COMPREHENSIVE METABOLIC PANEL - Abnormal; Notable for the following components:   Sodium 120 (*)    Potassium 5.6 (*)    Chloride 87 (*)    CO2 20 (*)    Glucose, Bld 136 (*)    BUN 49 (*)    Creatinine, Ser 1.33 (*)    Calcium 8.8 (*)    Total Protein 5.8 (*)    Albumin 2.5 (*)    AST 177 (*)    ALT 106 (*)    Alkaline Phosphatase 612 (*)    Total Bilirubin 1.9 (*)    GFR, Estimated 54 (*)    All other components within normal limits  CBG MONITORING, ED - Abnormal; Notable for the following components:   Glucose-Capillary 152 (*)    All other components within normal limits  I-STAT CHEM 8, ED - Abnormal; Notable for the following components:   Sodium 118 (*)    Potassium 5.6 (*)    Chloride 90 (*)    BUN 47 (*)    Creatinine, Ser 1.50 (*)    Glucose, Bld 135 (*)    Calcium, Ion 1.07 (*)    All other components within normal limits  ETHANOL    EKG None  Radiology CT HEAD CODE STROKE WO CONTRAST  Addendum Date: 03/05/2023   ADDENDUM REPORT: 03/06/2023 18:21 ADDENDUM: Impressions #1 and #2 were called by telephone at the time of interpretation on 02/25/2023 at 6:06 pm to provider Dr. Amada Jupiter, who verbally acknowledged these results. Electronically Signed   By: Jackey Loge D.O.   On: 02/22/2023 18:21   Result Date: 03/04/2023 CLINICAL DATA:  Code stroke. Neuro deficit, acute, stroke suspected. Aphasia, right-sided weakness, left gaze, left facial droop. EXAM: CT HEAD WITHOUT CONTRAST TECHNIQUE: Contiguous axial images were obtained from the base of the skull through the vertex without intravenous contrast. RADIATION DOSE REDUCTION: This exam was performed according to the departmental dose-optimization program which includes automated  exposure control, adjustment of the mA and/or kV according to patient size and/or use of iterative reconstruction technique. COMPARISON:  Concurrently performed CTA head/neck 03/18/2023. FINDINGS: Brain: Generalized cerebral atrophy. Patchy and ill-defined hypoattenuation within the cerebral white matter, nonspecific but compatible with mild chronic small vessel ischemic disease. There is no acute intracranial hemorrhage. No demarcated cortical infarct. No extra-axial fluid collection. No evidence of an intracranial mass. No midline shift. Vascular:  Hyperdensity of the M1 left middle cerebral artery consistent with the presence of thrombus (as demonstrated on the concurrently performed CTA head/neck). Atherosclerotic calcifications. Skull: No fracture or aggressive osseous lesion. Sinuses/Orbits: No mass or acute finding within the imaged orbits. No orbital mass or acute orbital finding. No significant paranasal sinus disease. ASPECTS [facility] Stroke Program Early CT Score) - Ganglionic level infarction (caudate, lentiform nuclei, internal capsule, insula, M1-M3 cortex): 7 - Supraganglionic infarction (M4-M6 cortex): 3 Total score (0-10 with 10 being normal): 10 Attempts are being major to ordering provider at this time. IMPRESSION: 1. No evidence of acute intracranial hemorrhage or an acute demarcated cortical infarct. ASPECTS is 10. 2. Asymmetric hyperdensity of the M1 left middle cerebral artery consistent with the presence of thrombus. 3. Parenchymal atrophy and chronic small vessel disease. Electronically Signed: By: Jackey Loge D.O. On: 03/20/2023 18:06   CT ANGIO HEAD NECK W WO CM (CODE STROKE)  Result Date: 03/15/2023 CLINICAL DATA:  Provided history: Neuro deficit, acute, stroke suspected. Aphasia, right-sided weakness, left gaze, left facial droop. EXAM: CT ANGIOGRAPHY HEAD AND NECK WITH AND WITHOUT CONTRAST TECHNIQUE: Multidetector CT imaging of the head and neck was performed using the standard  protocol during bolus administration of intravenous contrast. Multiplanar CT image reconstructions and MIPs were obtained to evaluate the vascular anatomy. Carotid stenosis measurements (when applicable) are obtained utilizing NASCET criteria, using the distal internal carotid diameter as the denominator. RADIATION DOSE REDUCTION: This exam was performed according to the departmental dose-optimization program which includes automated exposure control, adjustment of the mA and/or kV according to patient size and/or use of iterative reconstruction technique. CONTRAST:  75mL OMNIPAQUE IOHEXOL 350 MG/ML SOLN COMPARISON:  Noncontrast head CT performed earlier today 03/10/2023. FINDINGS: CTA NECK FINDINGS Aortic arch: The left vertebral artery arises directly from the aortic arch. Atherosclerotic plaque within the visualized aortic arch and proximal major branch vessels of the neck. No hemodynamically significant innominate or proximal subclavian artery stenosis. Right carotid system: CCA and ICA patent within the neck without stenosis or significant atherosclerotic disease. Tortuosity of the cervical ICA. Left carotid system: CCA and ICA patent within the neck without stenosis. Minimal atherosclerotic plaque within the CCA and about the carotid bifurcation. Tortuosity of the distal cervical ICA. Vertebral arteries: Vertebral arteries patent within the neck without stenosis. Nonstenotic atherosclerotic plaque at the left vertebral artery origin. Skeleton: Advanced cervical spondylosis. Posterior disc osteophyte complexes contribute to spinal canal stenosis which is at least moderate (and may be severe) at C3-C4, C4-C5, C5-C6 and C6-C7. No acute fracture or aggressive osseous lesion. Other neck: No neck mass or cervical lymphadenopathy. Upper chest: Partially imaged pleural effusions (larger on the right). Review of the MIP images confirms the above findings CTA HEAD FINDINGS Anterior circulation: The intracranial internal  carotid arteries are patent. Atherosclerotic plaque within both vessels with no more than mild stenosis. Abrupt occlusion of the left MCA proximal M1 segment (series 10, image 18) (series 11, image 25). There is some enhancement of M2 and more distal left MCA vessels due to collateral flow, however, this enhancement is significantly diminished as compared to that on the right. The M1 right middle cerebral artery is patent. No right M2 proximal branch occlusion or high-grade proximal stenosis. The anterior cerebral arteries are patent. Hypoplastic right A1 segment. Posterior circulation: The intracranial vertebral arteries are patent. Calcified plaque within the left V4 segment (distal to the PICA origin) resulting in mild-to-moderate stenosis. The basilar artery is patent. The posterior cerebral arteries are patent.  The right PCA is predominantly (or entirely) fetal in origin. A left posterior communicating artery is present. Venous sinuses: Evaluation for dural venous sinus thrombosis is limited due to contrast timing. Anatomic variants: As described. Review of the MIP images confirms the above findings CTA head impression #1 called by telephone at the time of interpretation on 03/09/2023 at 6:06 pm to provider MCNEILL [facility] , who verbally acknowledged these results. IMPRESSION: CTA neck: 1. The common carotid, internal carotid and vertebral arteries are patent within the neck without stenosis. Minimal atherosclerotic plaque within the left common carotid artery and about the left carotid bifurcation. Non-stenotic atherosclerotic plaque at the origin of the left vertebral artery. 2.  Aortic Atherosclerosis (ICD10-I70.0). 3. Partially imaged bilateral pleural effusions (right greater than left). CTA head: 1. Abrupt occlusion of the left middle cerebral artery proximal M1 segment. 2. Background intracranial atherosclerotic disease as described. Electronically Signed   By: Jackey Loge D.O.   On: 03/06/2023 18:21     Procedures Procedures    Medications Ordered in ED Medications  acetaminophen (TYLENOL) tablet 650 mg (has no administration in time range)    Or  acetaminophen (TYLENOL) suppository 650 mg (has no administration in time range)  haloperidol (HALDOL) tablet 0.5 mg (has no administration in time range)    Or  haloperidol (HALDOL) 2 MG/ML solution 0.5 mg (has no administration in time range)    Or  haloperidol lactate (HALDOL) injection 0.5 mg (has no administration in time range)  ondansetron (ZOFRAN-ODT) disintegrating tablet 4 mg (has no administration in time range)    Or  ondansetron (ZOFRAN) injection 4 mg (has no administration in time range)  glycopyrrolate (ROBINUL) tablet 1 mg (has no administration in time range)    Or  glycopyrrolate (ROBINUL) injection 0.2 mg (has no administration in time range)    Or  glycopyrrolate (ROBINUL) injection 0.2 mg (has no administration in time range)  antiseptic oral rinse (BIOTENE) solution 15 mL (has no administration in time range)  polyvinyl alcohol (LIQUIFILM TEARS) 1.4 % ophthalmic solution 1 drop (has no administration in time range)  HYDROmorphone (DILAUDID) injection 0.5 mg (has no administration in time range)  LORazepam (ATIVAN) tablet 1 mg (has no administration in time range)    Or  LORazepam (ATIVAN) injection 1 mg (has no administration in time range)  chlorproMAZINE (THORAZINE) 12.5 mg in sodium chloride 0.9 % 25 mL IVPB (has no administration in time range)  iohexol (OMNIPAQUE) 350 MG/ML injection 75 mL (75 mLs Intravenous Contrast Given 03/19/2023 1758)  0.9 %  sodium chloride infusion ( Intravenous New Bag/Given 03/05/2023 1923)    ED Course/ Medical Decision Making/ A&P                             Medical Decision Making Amount and/or Complexity of Data Reviewed Labs: ordered. Radiology: ordered.  Risk Prescription drug management. Decision regarding hospitalization.   82 year old male with history of metastatic  cancer presenting for code stroke.  Vital signs reviewed.  On exam he has left-sided gaze deviation with right-sided hemiparesis and neglect and aphasia concerning for acute CVA.  Patient evaluated with neurology at bedside.  He is maintaining his airway.  Blood sugar is normal.  CTA reviewed, he has left M1 occlusion.  Both myself and the neurology team discussed with family at bedside, he was already pursuing hospice and does not want aggressive medical therapy.  He is DNR.  They would like to  pursue hospice/comfort care.  Given his acute change in mental status, patient discussed with hospitalist team and admitted for further management.  I did review his labs, notable for hyponatremia, mild hyperkalemia.  Given goals of care no indication for further workup of this.        Final Clinical Impression(s) / ED Diagnoses Final diagnoses:  Cerebrovascular accident (CVA), unspecified mechanism [facility])    Rx / DC Orders ED Discharge Orders     None         Fulton Reek, MD 03/08/2023 2344    Lorre Nick, MD 03/06/2023 [phone]

## 2023-02-26 NOTE — H&P (Signed)
History and Physical    Francisco Olsen:[id number] DOB: 14-Feb-1941 DOA: 03/06/2023 [name] PCP: [name], MD  Patient coming from: Home  I have personally briefly reviewed patient's old medical records in [facility]  Chief Complaint: Right-sided weakness, aphasia  HPI: Francisco Olsen is a 82 y.o. male with medical history significant for metastatic pancreatic cancer with liver metastases, chronic atrial fibrillation on Coumadin, diabetes, HTN who presented from home via EMS after he was found to have aphasia and right-sided flaccidity.  Patient is unable to provide any history due to aphasia and is otherwise supplemented by EDP, chart review, and family at bedside.  Patient recently diagnosed with stage IV pancreatic cancer with liver metastasis.  After discussion with oncology, he clearly expressed wishes not to pursue any life-prolonging therapy or chemotherapy.  He was referred to hospice care who have just begun process for hospice at home.  Earlier today he was found at home slumped over, aphasic with left-sided gaze, and right-sided flaccidity.  EMS were called and he was brought to the ED for further evaluation.  Patient's spouse, daughter, and two of his sons are present at bedside at time of admission evaluation.  They confirm CODE STATUS is DNR and patient's wishes not to pursue aggressive care or life prolonging interventions.  They agree with comfort care measures only and seeking hospice placement.  ED Course  Labs/Imaging on admission: I have personally reviewed following labs and imaging studies.  Initial vitals showed BP 150/77, pulse 93, RR 21, temperature not recorded, SpO2 92% on room air.  Labs show WBC 16.7, hemoglobin 13.3, platelets 224,000, sodium 120, potassium 5.6, bicarb 20, BUN 49, creatinine 1.33, serum glucose 136, AST 177, ALT 106, alk phos 612, total bilirubin 1.9.  CT head without contrast negative for acute intracranial hemorrhage or  cortical infarct.  Asymmetric hyperdensity of the M1 left middle cerebral artery consistent with the presence of thrombus noted.  CTA head/neck showed abrupt occlusion of the left MCA proximal M1 segment.  Neurology were consulted.  After discussion with family decision was made to pursue comfort care measures only based on patient's previously expressed wishes.  The hospitalist service was consulted to admit for further evaluation and management.  Review of Systems: All systems reviewed and are negative except as documented in history of present illness above.   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 He has never used smokeless tobacco. He reports that he does not drink alcohol and does not use drugs.  Allergies  Allergen Reactions   Crestor [Rosuvastatin Calcium]  Other (See Comments)    Myalgias   Lipitor [Atorvastatin] Other (See Comments)    Muscle aches   Plavix [Clopidogrel Bisulfate] Rash    Family History  Problem Relation Age of Onset   CAD Father    CAD Sister    Diabetes Mellitus II Sister    CAD Brother    Diabetes Mellitus II Brother       Prior to Admission medications   Medication Sig Start Date End Date Taking? Authorizing Provider  amLODipine (NORVASC) 5 MG tablet TAKE 1 TABLET (5 MG TOTAL) BY MOUTH DAILY. PLEASE KEEP UPCOMING APPT FOR MORE FUTURE REFILLS. 11/17/22   Quintella Reichert, MD  metoprolol tartrate (LOPRESSOR) 50 MG tablet TAKE 1 TABLET BY MOUTH 2 (TWO) TIMES DAILY. PT NEEDS TO KEEP APPOINTMENT FOR # 90 SUPPLY. 10/02/22   Quintella Reichert, MD  Omega-3 Fatty Acids (FISH OIL PO) Take 1,000 mg by mouth daily.     [provider]  ondansetron (ZOFRAN) 8 MG tablet Take 1 tablet (8 mg total) by mouth every 8 (eight) hours as needed for nausea or vomiting. 02/20/23   Malachy Mood, MD  prochlorperazine (COMPAZINE) 10 MG tablet Take 1 tablet (10 mg total) by mouth every 6 (six) hours as needed for nausea or vomiting. 02/20/23   Malachy Mood, MD  traMADol (ULTRAM) 50 MG tablet Take 1-2 tablets (50-100 mg total) by mouth every 6 (six) hours as needed. 02/20/23   Malachy Mood, MD  warfarin (COUMADIN) 5 MG tablet TAKE 1 TABLET BY MOUTH DAILY OR AS DIRECTED BY COUMADIN CLINIC 11/17/22   Quintella Reichert, MD    Physical Exam: Vitals:   03/19/2023 1804 03/01/2023 1900 03/07/2023 2107 03/13/2023 2111  BP:  (!) 150/77 134/84   Pulse: 80 100 86   Resp: 14 (!) 21 17   Temp:   98.2 F (36.8 C)   TempSrc:   Oral   SpO2: 92% 91% 92%   Weight:      Height:    5\' 10"  (1.778 m)   Constitutional: Ill-appearing man lying supine in bed, aphasic Eyes: lids and conjunctivae normal ENMT: Mucous membranes are moist. upper airway gurgling sounds Neck: normal, supple, no masses. Respiratory: clear to auscultation anteriorly. Normal respiratory effort. No accessory muscle use.  Cardiovascular: Irregularly irregular, no murmurs / rubs / gallops.  +2 bilateral lower extremity edema. 2+ pedal pulses. Abdomen: no obvious tenderness Musculoskeletal: Right-sided hemiplegia, moves left arm and leg spontaneously Skin: no rashes, lesions, ulcers. No  induration Neurologic: Densely aphasic, not following commands, right-sided hemiplegia.  Moves left arm spontaneously against gravity, moves left leg spontaneously but not against gravity Psychiatric: Aphasic, not following commands  EKG: Personally reviewed. Atrial fibrillation, rate 95.  Rate is faster when compared to prior.  Assessment/Plan Principal Problem:   Acute ischemic left middle cerebral artery (MCA) stroke (HCC) Active Problems:   Permanent atrial fibrillation (HCC)   Long term (current) use of anticoagulants   Essential hypertension, benign   Pancreatic cancer metastasized to liver (HCC)   Comfort measures only status   Hyponatremia   Hyperkalemia   AKI (acute kidney injury) (HCC)   Elevated LFTs   Francisco Olsen is a 82 y.o. male with medical history significant for metastatic pancreatic cancer with liver metastases, chronic atrial fibrillation on Coumadin, diabetes, HTN who is admitted with acute CVA due to left M1 occlusion now on comfort care measures only.  Assessment and Plan: Acute CVA due to left M1 occlusion with aphasia and  right-sided flaccid paralysis--now on comfort care measures only: Presenting with aphasia and right-sided flaccid paralysis.  Imaging shows left M1 occlusion.  Patient previously expressed wishes not to pursue life-prolonging therapy.  Family confirmed his wishes not to pursue any aggressive measures.  They confirm CODE STATUS is DNR and agree with comfort care measures only. -Comfort care measures only -Avoid further workup of stroke or any aggressive interventions -Meds as needed for pain, anxiety, agitation -CODE STATUS is DNR -Consult to palliative care  Stage IV pancreatic cancer with liver metastases Chronic atrial fibrillation on Coumadin Hyponatremia Hyperkalemia Acute kidney injury Elevated LFTs Hypertension   DVT prophylaxis: None Code Status: DNR, comfort care measures only Family Communication: Spouse, daughter, sons at  bedside Disposition Plan: Anticipate discharge to inpatient hospice Consults called: Neurology, palliative care Severity of Illness: The appropriate patient status for this patient is INPATIENT. Inpatient status is judged to be reasonable and necessary in order to provide the required intensity of service to ensure the patient's safety. The patient's presenting symptoms, physical exam findings, and initial radiographic and laboratory data in the context of their chronic comorbidities is felt to place them at high risk for further clinical deterioration. Furthermore, it is not anticipated that the patient will be medically stable for discharge from the hospital within 2 midnights of admission.   * I certify that at the point of admission it is my clinical judgment that the patient will require inpatient hospital care spanning beyond 2 midnights from the point of admission due to high intensity of service, high risk for further deterioration and high frequency of surveillance required.Darreld Mclean MD [facility]  If 7PM-7AM, please contact night-coverage [email or web address]  02/25/2023, 9:12 PM

## 2023-02-26 NOTE — Consult Note (Signed)
Neurology Consultation Reason for Consult: Right-sided weakness Referring Physician: Allena Katz, V  CC: Right-sided weakness  History is obtained from: Patient  HPI: Francisco Olsen is a 82 y.o. male with a history of newly diagnosed metastatic pancreatic cancer who has chosen not to pursue treatment and instead focus on comfort.  He does have a history of atrial fibrillation and is on anticoagulation with warfarin and was most recently subtherapeutic at 1.39 days ago.  He was in his normal state of health until 4:20 p.m.  He was last seen normal about 3:00 when he laid down for a nap, but subsequently his wife found him to be slumping over to the right side and EMS was called.   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 He has never used smokeless tobacco. He reports that he does not drink alcohol and does not use drugs.   Exam: Current vital signs: BP (!) 150/77   Pulse 100   Resp (!) 21   Wt 79.2 kg   SpO2 91%   BMI 24.70 kg/m  Vital signs in last 24 hours: Pulse Rate:  [80-100] 100 (05/09 1900) Resp:  [14-21] 21 (05/09 1900) BP: (133-150)/(67-77) 150/77 (05/09 1900) SpO2:  [91 %-93 %] 91 % (05/09 1900) Weight:  [79.2 kg] 79.2 kg (05/09 1700)   Physical Exam  Appears well-developed and well-nourished.   Neuro: Mental Status: Patient is awake, he  is densely aphasic, he does not open eyes or follow commands. Cranial Nerves: II: Does not blink to threat from the right. Pupils are equal, round, and reactive to light.   III,IV, VI: Left gaze deviation VII: Facial movement is weak on right Motor: He is plegic on the right, though he does move his toes to noxious stimulation, he does not hold his left leg against gravity but unclear if compliance or weakness. sensory: He responds less to noxious simulation on the right and left Cerebellar: Does not perform     I have reviewed labs in epic and the results pertinent to this consultation are: INR 2.0  I have reviewed the images obtained: CT/CTA-left M1 occlusion  Impression: 82 year old male with metastatic pancreatic cancer who has expressed wishes to pursue no types of life-prolonging therapy.  I discussed this with his family who confirmed that his wishes would be to not pursue any aggressive measures.  In the setting, he would not be a candidate for mechanical thrombectomy.  I discussed this with the family who wish to pursue comfort only care.  The major determination of his prognosis (meaning how long he will survive) will be whether or not he eats and drinks.  I think it is possible that he could, but also possible that he  will not be able to.  Recommendations: 1) agree with transition to comfort only care 2) please call neurology with any questions or concerns.   Ritta Slot, MD [facility] [phone]  If 7pm- 7am, please page neurology on call as listed in AMION.

## 2023-02-26 NOTE — ED Provider Notes (Signed)
I saw and evaluated the patient, reviewed the resident's note and I agree with the findings and plan.   Patient presented from home due to being found aphasic with left-sided gaze and flaccid on the right side just prior to arrival.  Patient is on warfarin for A-fib.  CT scan consistent with M1 occlusion according to neurology.  Patient also severely hyponatremic with sodium of 118.  Neurologist has discussed the case with the family and patient be made comfort care at this time.  Will be admitted to the hospital service   Lorre Nick, MD 03/06/2023 1820

## 2023-02-26 NOTE — ED Notes (Signed)
ED TO INPATIENT HANDOFF REPORT  ED Nurse Name and Phone #: Theadora Rama RN  S Name/Age/Gender Francisco Olsen 82 y.o. male Room/Bed: 017C/017C  Code Status   Code Status: DNR  Home/SNF/Other Home  Is this baseline? GCS 11  Triage Complete: Triage complete  Chief Complaint Acute ischemic left middle cerebral artery (MCA) stroke (HCC) [I63.512]  Triage Note Pt bib ems from home as code stroke; LKW 1620 this afternoon; pt found in bathroom aphasic, with L sided gaze and flaccidity on R side; pt normally ambulatory, conversational; pt on warfarin for afib; sats 80s on RA, place on [place]; cbg 152, 128/90, HR 85; 18 ga lac   Allergies Allergies  Allergen Reactions   Crestor [Rosuvastatin Calcium] Other (See Comments)    Myalgias   Lipitor [Atorvastatin] Other (See Comments)    Muscle aches   Plavix [Clopidogrel Bisulfate] Rash    Level of Care/Admitting Diagnosis ED Disposition     ED Disposition  Admit   Condition  --   Comment  Hospital [facility] [[id number]]  Level of Care: Palliative Care [15]  May admit patient to [facility] or [facility] if equivalent level of care is available:: No  Covid Evaluation: Asymptomatic - no recent exposure (last 10 days) testing not required  Diagnosis: Acute ischemic left middle cerebral artery (MCA) stroke [facility]) [[id number]]  Admitting Physician: Charlsie Quest [[id number]]  Attending Physician: Charlsie Quest [[id number]]  Bed request comments: Comfort care  Certification:: I certify this patient will need inpatient services for at least 2 midnights  Estimated Length of Stay: 3          B Medical/Surgery History Past Medical History:  Diagnosis Date   BPH (benign prostatic hyperplasia)    Cancer (HCC)    Carotid artery stenosis    1-39% bilateral by dopplers 08/2020   Chronic atrial fibrillation (HCC)    on chronic systemic anticoagulation with warfarin   Diabetes mellitus without complication (HCC)    GERD  (gastroesophageal reflux disease)    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   Hyperlipidemia    Hypertension    Past Surgical History:  Procedure Laterality Date   cardioversion x 2     CHOLECYSTECTOMY     HERNIA REPAIR     INGUINAL LYMPH NODE BIOPSY Left 03/20/2020   Procedure: LEFT INGUINAL LYMPH NODE BIOPSY;  Surgeon: [name], MD;  Location: [facility];  Service: General;  Laterality: Left;   LAMINECTOMY     SEPTOPLASTY     TONSILECTOMY, ADENOIDECTOMY, BILATERAL MYRINGOTOMY AND TUBES       A IV Location/Drains/Wounds Patient Lines/Drains/Airways Status     Active Line/Drains/Airways     Name Placement date Placement time Site Days   Peripheral IV 03/02/2023 18 G Left Antecubital 02/21/2023  --  Antecubital  less than 1            Intake/Output Last 24 hours No intake or output data in the 24 hours ending 03/15/2023 2016  Labs/Imaging Results for orders placed or performed during the hospital encounter of 03/19/2023 (from the past 48 hour(s))  CBG monitoring, ED     Status: Abnormal   Collection Time: 03/02/2023  5:44 PM  Result Value Ref Range   Glucose-Capillary 152 (H) 70 - 99 mg/dL    Comment: Glucose reference range applies only to samples taken after fasting for at least 8 hours.  Ethanol     Status: None   Collection Time: 02/23/2023  5:46 PM  Result Value Ref Range   Alcohol, Ethyl (B) <10 <10 mg/dL    Comment: (NOTE) Lowest detectable limit for serum alcohol is 10 mg/dL.  For medical purposes only. Performed at [facility], [address], [place], [place] [address]   Protime-INR     Status: Abnormal   Collection Time: 03/17/2023  5:46 PM  Result Value Ref Range   Prothrombin Time 22.6 (H) 11.4 - 15.2 seconds   INR 2.0 (H) 0.8 - 1.2    Comment: (NOTE) INR goal varies based on device and disease states. Performed at [facility], [address], [place], [place] [address]   APTT     Status: Abnormal   Collection Time: 02/19/2023  5:46  PM  Result Value Ref Range   aPTT 38 (H) 24 - 36 seconds    Comment:        IF BASELINE aPTT IS ELEVATED, SUGGEST PATIENT RISK ASSESSMENT BE USED TO DETERMINE APPROPRIATE ANTICOAGULANT THERAPY. Performed at [facility], [address] [place], [place] [address]   CBC     Status: Abnormal   Collection Time: 03/07/2023  5:46 PM  Result Value Ref Range   WBC 16.7 (H) 4.0 - 10.5 K/uL   RBC 4.48 4.22 - 5.81 MIL/uL   Hemoglobin 13.3 13.0 - 17.0 g/dL   HCT 91.4 (L) 78.2 - 95.6 %   MCV 85.5 80.0 - 100.0 fL   MCH 29.7 26.0 - 34.0 pg   MCHC 34.7 30.0 - 36.0 g/dL   RDW 21.3 08.6 - 57.8 %   Platelets 224 150 - 400 K/uL   nRBC 0.0 0.0 - 0.2 %    Comment: Performed at [facility], [address] [place], [place] [address]  Differential     Status: Abnormal   Collection Time: 03/08/2023  5:46 PM  Result Value Ref Range   Neutrophils Relative % 82 %   Neutro Abs 13.7 (H) 1.7 - 7.7 K/uL   Lymphocytes Relative 8 %   Lymphs Abs 1.3 0.7 - 4.0 K/uL   Monocytes Relative 9 %   Monocytes Absolute 1.5 (H) 0.1 - 1.0 K/uL   Eosinophils Relative 0 %   Eosinophils Absolute 0.0 0.0 - 0.5 K/uL   Basophils Relative 0 %   Basophils Absolute 0.0 0.0 - 0.1 K/uL   Immature Granulocytes 1 %   Abs Immature Granulocytes 0.18 (H) 0.00 - 0.07 K/uL    Comment: Performed at [facility], [address] [place], [place] [address]  Comprehensive metabolic panel     Status: Abnormal   Collection Time: 02/26/23  5:46 PM  Result Value Ref Range   Sodium 120 (L) 135 - 145 mmol/L   Potassium 5.6 (H) 3.5 - 5.1 mmol/L   Chloride 87 (L) 98 - 111 mmol/L   CO2 20 (L) 22 - 32 mmol/L   Glucose, Bld 136 (H) 70 - 99 mg/dL    Comment: Glucose reference range applies only to samples taken after fasting for at least 8 hours.   BUN 49 (H) 8 - 23 mg/dL   Creatinine, Ser 1.32 (H) 0.61 - 1.24 mg/dL   Calcium 8.8 (L) 8.9 - 10.3 mg/dL   Total Protein 5.8 (L) 6.5 - 8.1 g/dL   Albumin 2.5 (L) 3.5 - 5.0 g/dL   AST  440 (H) 15 - 41 U/L   ALT 106 (H) 0 - 44 U/L   Alkaline Phosphatase 612 (H)  38 - 126 U/L   Total Bilirubin 1.9 (H) 0.3 - 1.2 mg/dL   GFR, Estimated 54 (L) >60 mL/min    Comment: (NOTE) Calculated using the CKD-EPI Creatinine Equation (2021)    Anion gap 11 5 - 15    Comment: Performed at [facility], [address], [place], [place] [address]  I-stat chem 8, ED     Status: Abnormal   Collection Time: 03/10/2023  5:50 PM  Result Value Ref Range   Sodium 118 (LL) 135 - 145 mmol/L   Potassium 5.6 (H) 3.5 - 5.1 mmol/L   Chloride 90 (L) 98 - 111 mmol/L   BUN 47 (H) 8 - 23 mg/dL   Creatinine, Ser 1.91 (H) 0.61 - 1.24 mg/dL   Glucose, Bld 478 (H) 70 - 99 mg/dL    Comment: Glucose reference range applies only to samples taken after fasting for at least 8 hours.   Calcium, Ion 1.07 (L) 1.15 - 1.40 mmol/L   TCO2 23 22 - 32 mmol/L   Hemoglobin 14.3 13.0 - 17.0 g/dL   HCT 29.5 62.1 - 30.8 %   Comment NOTIFIED PHYSICIAN    CT HEAD CODE STROKE WO CONTRAST  Addendum Date: 02/21/2023   ADDENDUM REPORT: 02/18/2023 18:21 ADDENDUM: Impressions #1 and #2 were called by telephone at the time of interpretation on 02/23/2023 at 6:06 pm to provider Dr. Amada Jupiter, who verbally acknowledged these results. Electronically Signed   By: Jackey Loge D.O.   On: 02/20/2023 18:21   Result Date: 02/28/2023 CLINICAL DATA:  Code stroke. Neuro deficit, acute, stroke suspected. Aphasia, right-sided weakness, left gaze, left facial droop. EXAM: CT HEAD WITHOUT CONTRAST TECHNIQUE: Contiguous axial images were obtained from the base of the skull through the vertex without intravenous contrast. RADIATION DOSE REDUCTION: This exam was performed according to the departmental dose-optimization program which includes automated exposure control, adjustment of the mA and/or kV according to patient size and/or use of iterative reconstruction technique. COMPARISON:  Concurrently performed CTA head/neck 03/18/2023. FINDINGS: Brain:  Generalized cerebral atrophy. Patchy and ill-defined hypoattenuation within the cerebral white matter, nonspecific but compatible with mild chronic small vessel ischemic disease. There is no acute intracranial hemorrhage. No demarcated cortical infarct. No extra-axial fluid collection. No evidence of an intracranial mass. No midline shift. Vascular: Hyperdensity of the M1 left middle cerebral artery consistent with the presence of thrombus (as demonstrated on the concurrently performed CTA head/neck). Atherosclerotic calcifications. Skull: No fracture or aggressive osseous lesion. Sinuses/Orbits: No mass or acute finding within the imaged orbits. No orbital mass or acute orbital finding. No significant paranasal sinus disease. ASPECTS [facility] Stroke Program Early CT Score) - Ganglionic level infarction (caudate, lentiform nuclei, internal capsule, insula, M1-M3 cortex): 7 - Supraganglionic infarction (M4-M6 cortex): 3 Total score (0-10 with 10 being normal): 10 Attempts are being major to ordering provider at this time. IMPRESSION: 1. No evidence of acute intracranial hemorrhage or an acute demarcated cortical infarct. ASPECTS is 10. 2. Asymmetric hyperdensity of the M1 left middle cerebral artery consistent with the presence of thrombus. 3. Parenchymal atrophy and chronic small vessel disease. Electronically Signed: By: Jackey Loge D.O. On: 02/24/2023 18:06   CT ANGIO HEAD NECK W WO CM (CODE STROKE)  Result Date: 03/02/2023 CLINICAL DATA:  Provided history: Neuro deficit, acute, stroke suspected. Aphasia, right-sided weakness, left gaze, left facial droop. EXAM: CT ANGIOGRAPHY HEAD AND NECK WITH AND WITHOUT CONTRAST TECHNIQUE: Multidetector CT imaging of the head and neck was performed using the standard  protocol during bolus administration of intravenous contrast. Multiplanar CT image reconstructions and MIPs were obtained to evaluate the vascular anatomy. Carotid stenosis measurements (when applicable) are  obtained utilizing NASCET criteria, using the distal internal carotid diameter as the denominator. RADIATION DOSE REDUCTION: This exam was performed according to the departmental dose-optimization program which includes automated exposure control, adjustment of the mA and/or kV according to patient size and/or use of iterative reconstruction technique. CONTRAST:  75mL OMNIPAQUE IOHEXOL 350 MG/ML SOLN COMPARISON:  Noncontrast head CT performed earlier today 03/08/2023. FINDINGS: CTA NECK FINDINGS Aortic arch: The left vertebral artery arises directly from the aortic arch. Atherosclerotic plaque within the visualized aortic arch and proximal major branch vessels of the neck. No hemodynamically significant innominate or proximal subclavian artery stenosis. Right carotid system: CCA and ICA patent within the neck without stenosis or significant atherosclerotic disease. Tortuosity of the cervical ICA. Left carotid system: CCA and ICA patent within the neck without stenosis. Minimal atherosclerotic plaque within the CCA and about the carotid bifurcation. Tortuosity of the distal cervical ICA. Vertebral arteries: Vertebral arteries patent within the neck without stenosis. Nonstenotic atherosclerotic plaque at the left vertebral artery origin. Skeleton: Advanced cervical spondylosis. Posterior disc osteophyte complexes contribute to spinal canal stenosis which is at least moderate (and may be severe) at C3-C4, C4-C5, C5-C6 and C6-C7. No acute fracture or aggressive osseous lesion. Other neck: No neck mass or cervical lymphadenopathy. Upper chest: Partially imaged pleural effusions (larger on the right). Review of the MIP images confirms the above findings CTA HEAD FINDINGS Anterior circulation: The intracranial internal carotid arteries are patent. Atherosclerotic plaque within both vessels with no more than mild stenosis. Abrupt occlusion of the left MCA proximal M1 segment (series 10, image 18) (series 11, image 25).  There is some enhancement of M2 and more distal left MCA vessels due to collateral flow, however, this enhancement is significantly diminished as compared to that on the right. The M1 right middle cerebral artery is patent. No right M2 proximal branch occlusion or high-grade proximal stenosis. The anterior cerebral arteries are patent. Hypoplastic right A1 segment. Posterior circulation: The intracranial vertebral arteries are patent. Calcified plaque within the left V4 segment (distal to the PICA origin) resulting in mild-to-moderate stenosis. The basilar artery is patent. The posterior cerebral arteries are patent. The right PCA is predominantly (or entirely) fetal in origin. A left posterior communicating artery is present. Venous sinuses: Evaluation for dural venous sinus thrombosis is limited due to contrast timing. Anatomic variants: As described. Review of the MIP images confirms the above findings CTA head impression #1 called by telephone at the time of interpretation on 02/24/2023 at 6:06 pm to provider [facility] , who verbally acknowledged these results. IMPRESSION: CTA neck: 1. The common carotid, internal carotid and vertebral arteries are patent within the neck without stenosis. Minimal atherosclerotic plaque within the left common carotid artery and about the left carotid bifurcation. Non-stenotic atherosclerotic plaque at the origin of the left vertebral artery. 2.  Aortic Atherosclerosis (ICD10-I70.0). 3. Partially imaged bilateral pleural effusions (right greater than left). CTA head: 1. Abrupt occlusion of the left middle cerebral artery proximal M1 segment. 2. Background intracranial atherosclerotic disease as described. Electronically Signed   By: Jackey Loge D.O.   On: 02/25/2023 18:21    Pending Labs Unresulted Labs (From admission, onward)    None       Vitals/Pain Today's Vitals   03/15/2023 1700 02/20/2023 1802 03/02/2023 1804 03/03/2023 1900  BP:  133/67  Marland Kitchen)  150/77  Pulse:   88 80 100  Resp:   14 (!) 21  SpO2:  93% 92% 91%  Weight: 79.2 kg       Isolation Precautions No active isolations  Medications Medications  acetaminophen (TYLENOL) tablet 650 mg (has no administration in time range)    Or  acetaminophen (TYLENOL) suppository 650 mg (has no administration in time range)  haloperidol (HALDOL) tablet 0.5 mg (has no administration in time range)    Or  haloperidol (HALDOL) 2 MG/ML solution 0.5 mg (has no administration in time range)    Or  haloperidol lactate (HALDOL) injection 0.5 mg (has no administration in time range)  ondansetron (ZOFRAN-ODT) disintegrating tablet 4 mg (has no administration in time range)    Or  ondansetron (ZOFRAN) injection 4 mg (has no administration in time range)  glycopyrrolate (ROBINUL) tablet 1 mg (has no administration in time range)    Or  glycopyrrolate (ROBINUL) injection 0.2 mg (has no administration in time range)    Or  glycopyrrolate (ROBINUL) injection 0.2 mg (has no administration in time range)  antiseptic oral rinse (BIOTENE) solution 15 mL (has no administration in time range)  polyvinyl alcohol (LIQUIFILM TEARS) 1.4 % ophthalmic solution 1 drop (has no administration in time range)  HYDROmorphone (DILAUDID) injection 0.5 mg (has no administration in time range)  LORazepam (ATIVAN) tablet 1 mg (has no administration in time range)    Or  LORazepam (ATIVAN) injection 1 mg (has no administration in time range)  chlorproMAZINE (THORAZINE) 12.5 mg in sodium chloride 0.9 % 25 mL IVPB (has no administration in time range)  iohexol (OMNIPAQUE) 350 MG/ML injection 75 mL (75 mLs Intravenous Contrast Given 03/01/2023 1758)  0.9 %  sodium chloride infusion ( Intravenous New Bag/Given 03/17/2023 1923)    Mobility non-ambulatory     Focused Assessments Neuro Assessment Handoff:  Swallow screen pass? No    NIH Stroke Scale  Dizziness Present: No Headache Present: No Interval: Initial Level of Consciousness (1a.)    : Alert, keenly responsive LOC Questions (1b. )   : Answers neither question correctly LOC Commands (1c. )   : Performs neither task correctly Best Gaze (2. )  : Forced deviation Visual (3. )  : Complete hemianopia Facial Palsy (4. )    : Partial paralysis  Motor Arm, Left (5a. )   : No drift Motor Arm, Right (5b. ) : No movement Motor Leg, Left (6a. )  : No effort against gravity Motor Leg, Right (6b. ) : No effort against gravity Limb Ataxia (7. ): Absent Sensory (8. )  : Severe to total sensory loss, patient is not aware of being touched in the face, arm, and leg Best Language (9. )  : Mute, global aphasia Dysarthria (10. ): Severe dysarthria, patient's speech is so slurred as to be unintelligible in the absence of or out of proportion to any dysphasia, or is mute/anarthric Extinction/Inattention (11.)   : Profound hemi-inattention or extinction to more than one modality Complete NIHSS TOTAL: 29 Last date known well: 03/01/2023 Last time known well: 1620 Neuro Assessment:   Neuro Checks:   Initial (03/19/2023 1800)  Has TPA been given? No If patient is a Neuro Trauma and patient is going to OR before floor call report to 4N Charge nurse: [phone] or [phone]   R Recommendations: See Admitting Provider Note  Report given to:   Additional Notes:

## 2023-02-26 NOTE — ED Triage Notes (Signed)
Pt bib ems from home as code stroke; LKW 1620 this afternoon; pt found in bathroom aphasic, with L sided gaze and flaccidity on R side; pt normally ambulatory, conversational; pt on warfarin for afib; sats 80s on RA, place on [place]; cbg 152, 128/90, HR 85; 18 ga lac

## 2023-02-26 NOTE — Hospital Course (Signed)
Francisco Olsen is a 82 y.o. male with medical history significant for metastatic pancreatic cancer with liver metastases, chronic atrial fibrillation on Coumadin, diabetes, HTN who is admitted with acute CVA due to left M1 occlusion now on comfort care measures only.

## 2023-02-27 ENCOUNTER — Ambulatory Visit: Payer: Medicare Other | Admitting: Hematology

## 2023-02-27 DIAGNOSIS — I63512 Cerebral infarction due to unspecified occlusion or stenosis of left middle cerebral artery: Secondary | ICD-10-CM | POA: Diagnosis not present

## 2023-02-27 DIAGNOSIS — Z7189 Other specified counseling: Secondary | ICD-10-CM

## 2023-02-27 DIAGNOSIS — Z515 Encounter for palliative care: Principal | ICD-10-CM

## 2023-02-27 MED ORDER — HYDROMORPHONE HCL-NACL 50-0.9 MG/50ML-% IV SOLN
0.5000 mg/h | INTRAVENOUS | Status: DC
  Administered 2023-02-27: 2 mg/h via INTRAVENOUS
  Administered 2023-02-27: 4 mg/h via INTRAVENOUS
  Filled 2023-02-27 (×2): qty 50

## 2023-02-27 MED ORDER — ATROPINE SULFATE 1 % OP SOLN
2.0000 [drp] | Freq: Four times a day (QID) | OPHTHALMIC | Status: DC
  Administered 2023-02-27 (×2): 2 [drp] via SUBLINGUAL
  Filled 2023-02-27: qty 2

## 2023-02-27 MED ORDER — GLYCOPYRROLATE 0.2 MG/ML IJ SOLN
0.2000 mg | Freq: Once | INTRAMUSCULAR | Status: AC
  Administered 2023-02-27: 0.2 mg via INTRAVENOUS

## 2023-02-27 MED ORDER — GLYCOPYRROLATE 0.2 MG/ML IJ SOLN
0.4000 mg | INTRAMUSCULAR | Status: DC
  Administered 2023-02-27 (×3): 0.4 mg via INTRAVENOUS
  Filled 2023-02-27 (×3): qty 2

## 2023-03-21 NOTE — Progress Notes (Signed)
TR[facility] floor coverage note:  Called by RN and I evaluated patient at bedside: pt with gurgling respirations audible from hallway and now starting to desat.  Unable to get anything with oral suctioning only.  Deep suctioning / NT suctioning not typically done on comfort measure patients per charge RN (nor would it likely change final outcome).  Pt is clearly aspirating on own secretions at this point and not guarding his airway.  Called patient's wife and gave her update on pt doing very poorly and that I suspected he might not make it through the night.  She thanked me for letting her know.  She re-confirmed the DNR/DNI status and desire to keep him comfortable.  Will order dilaudid GTT, titrate to comfort.  RN also giving robinul.

## 2023-03-21 NOTE — Death Summary Note (Signed)
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 male with medical history significant for metastatic pancreatic cancer with liver metastases, chronic atrial fibrillation on Coumadin, diabetes, HTN who presented from home via EMS after he was found to have aphasia and right-sided flaccidity.   Patient recently diagnosed with stage IV pancreatic cancer with liver metastasis.  After discussion with oncology, he clearly expressed wishes not to pursue any life-prolonging therapy or chemotherapy.  He was referred to hospice care who have just begun process for hospice at home.   On day of admission he was found at home slumped over, aphasic with left-sided gaze, and right-sided flaccidity.  EMS were called and he was brought to the ED for further evaluation.   Family confirmed CODE STATUS is DNR and patient's wishes not to pursue aggressive care or life prolonging interventions.  They agreed with comfort care measures only and seeking hospice placement.   ED Course vitals showed BP 150/77, pulse 93, RR 21, temperature not recorded, SpO2 92% on room air.   Labs show WBC 16.7, hemoglobin 13.3, platelets 224,000, sodium 120, potassium 5.6, bicarb 20, BUN 49, creatinine 1.33, serum glucose 136, AST 177, ALT 106, alk phos 612, total bilirubin 1.9.   CT head without contrast negative for acute intracranial hemorrhage or cortical infarct.   Asymmetric hyperdensity of the M1 left middle cerebral artery consistent with the presence of thrombus noted.   CTA head/neck showed abrupt occlusion of the left MCA proximal M1 segment.   Neurology were consulted.  After discussion with family decision was made to pursue comfort care measures only based on patient's previously expressed wishes.  The hospitalist service was consulted to admit for further evaluation and management.  Hospital Course:  Patient admitted in the hospital and transitioned to full comfort care.  Palliative care was consulted.  Patient passed away peacefully on April 05, 1705 on 02/23/2023.   Time: 04-05-1705  Signed:  Avonda Toso R Melodie Ashworth  [facility] March 27, 2023, 3:57 PM

## 2023-03-21 NOTE — Progress Notes (Signed)
Civil engineer, [employer] [facility]) Hospital Liaison Note  This is a current [facility] hospice patient. Appears to be appropriate for [facility]. However, does not appear to be stable for transport.   Please call with any questions or concerns. Thank you  Dionicio Stall, [facility] Liaison [phone]

## 2023-03-21 NOTE — TOC Progression Note (Signed)
Transition of Care [facility]) - Progression Note    Patient Details  Name: Francisco Olsen MRN: [id number] Date of Birth: [birth year]  Transition of [facility]) CM/SW Contact  Janae Bridgeman, RN Phone Number: 02/21/2023, 2:59 PM  Clinical Narrative:    [facility] Team will continue to follow the patient for needs on the unit.  [facility] is following the patient  - patient is on comfort care and hospital death is anticipated.  Chaplain following for spiritual support.        Expected Discharge Plan and Services                                               Social Determinants of Health (SDOH) Interventions SDOH Screenings   Food Insecurity: No Food Insecurity (02/23/2023)  Housing: Low Risk  (02/28/2023)  Transportation Needs: No Transportation Needs (02/25/2023)  Utilities: Not At Risk (03/18/2023)  Tobacco Use: Low Risk  (02/24/2023)    Readmission Risk Interventions     No data to display

## 2023-03-21 NOTE — Progress Notes (Signed)
This chaplain responded to PMT NP-Michelle consult for EOL spiritual care. The chaplain was updated by the RN before the visit. The Pt. is resting comfortably with his family at the bedside.   The family accepted the chaplain's invitation for story telling in the setting of getting to know the Pt. The chaplain learned the Pt. can do anything with his hands and curious mind. The chaplain understands there are many memories of activities with the Pt.  The Pt. family is open to ongoing PMT relationship and spiritual care visits. The chaplain provided education on how to reach a chaplain through the RN.  The family accepted the chaplain's invitation for prayer.  Chaplain Stephanie Acre [phone]

## 2023-03-21 NOTE — Progress Notes (Signed)
PROGRESS NOTE    Francisco Olsen  ZOX:[id number] DOB: Jul 20, 1941 DOA: 02/21/2023 PCP: [name], MD   Brief Narrative:  Francisco Olsen is a 82 y.o. male with medical history significant for metastatic pancreatic cancer with liver metastases, chronic atrial fibrillation on Coumadin, diabetes, HTN who presented from home via EMS after he was found to have aphasia and right-sided flaccidity.  Patient is unable to provide any history due to aphasia and is otherwise supplemented by EDP, chart review, and family at bedside.   Patient recently diagnosed with stage IV pancreatic cancer with liver metastasis.  After discussion with oncology, he clearly expressed wishes not to pursue any life-prolonging therapy or chemotherapy.  He was referred to hospice care who have just begun process for hospice at home.   Earlier today he was found at home slumped over, aphasic with left-sided gaze, and right-sided flaccidity.  EMS were called and he was brought to the ED for further evaluation.   Family confirmed CODE STATUS is DNR and patient's wishes not to pursue aggressive care or life prolonging interventions.  They agree with comfort care measures only and seeking hospice placement.   ED Course vitals showed BP 150/77, pulse 93, RR 21, temperature not recorded, SpO2 92% on room air.   Labs show WBC 16.7, hemoglobin 13.3, platelets 224,000, sodium 120, potassium 5.6, bicarb 20, BUN 49, creatinine 1.33, serum glucose 136, AST 177, ALT 106, alk phos 612, total bilirubin 1.9.   CT head without contrast negative for acute intracranial hemorrhage or cortical infarct.  Asymmetric hyperdensity of the M1 left middle cerebral artery consistent with the presence of thrombus noted.   CTA head/neck showed abrupt occlusion of the left MCA proximal M1 segment.   Neurology were consulted.  After discussion with family decision was made to pursue comfort care measures only based on patient's previously expressed wishes.   The hospitalist service was consulted to admit for further evaluation and management.    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  Palliative care on board. -Will hold off transfer to inpatient hospice at this time.  Will continue care in the hospital. Very poor prognosis, family is aware.    DVT prophylaxis: None Code Status: DNR/DNI-on comfort measure Family Communication: Family present at bedside.  Plan of care discussed with patient family and they verbalized understanding   Consultants:  Neurology Palliative  Procedures:  None  Antimicrobials:  None  Status is: Inpatient     Subjective: Patient seen and examined.  Family at the bedside.  Copious amount of oral secretions.  Family declined suctioning.  Wanted to keep patient comfortable as much as possible.  Objective: Vitals:   03/19/2023 1804 03/05/2023 1900 03/12/2023 2107 03/01/2023 2111  BP:  (!) 150/77 134/84   Pulse: 80 100 86   Resp: 14 (!) 21 17   Temp:   98.2 F (36.8 C)   TempSrc:   Oral   SpO2: 92% 91% 92%   Weight:      Height:    5\' 10"  (1.778 m)    Intake/Output Summary (Last 24 hours) at 03/15/2023 1017 Last data filed at 03/09/2023 2107 Gross per 24 hour  Intake 0 ml  Output --  Net 0 ml   Filed Weights   02/21/2023 1700  Weight: 79.2 kg  Examination:  General exam: Appears weak, cachectic, sick, copious amount of oral secretions.  Gurgling sound.  Family at the bedside Central nervous system: Lying comfortably.  Aphasic.  Not following commands Extremities: Bilateral 2+ pitting edema positive  Data Reviewed: I have personally reviewed following labs and imaging studies  CBC: Recent Labs  Lab 02/20/23 1324 03/11/2023 1746 03/11/2023 1750   WBC 15.1* 16.7*  --   NEUTROABS 12.5* 13.7*  --   HGB 13.5 13.3 14.3  HCT 40.6 38.3* 42.0  MCV 88.5 85.5  --   PLT 222 224  --    Basic Metabolic Panel: Recent Labs  Lab 02/20/23 1324 03/08/2023 1746 03/16/2023 1750  NA 128* 120* 118*  K 4.8 5.6* 5.6*  CL 92* 87* 90*  CO2 26 20*  --   GLUCOSE 116* 136* 135*  BUN 26* 49* 47*  CREATININE 0.87 1.33* 1.50*  CALCIUM 9.3 8.8*  --    GFR: Estimated Creatinine Clearance: 40.6 mL/min (A) (by C-G formula based on SCr of 1.5 mg/dL (H)). Liver Function Tests: Recent Labs  Lab 02/20/23 1324 02/19/2023 1746  AST 88* 177*  ALT 56* 106*  ALKPHOS 393* 612*  BILITOT 2.0* 1.9*  PROT 7.0 5.8*  ALBUMIN 3.7 2.5*   No results for input(s): "LIPASE", "AMYLASE" in the last 168 hours. No results for input(s): "AMMONIA" in the last 168 hours. Coagulation Profile: Recent Labs  Lab 03/15/2023 1746  INR 2.0*   Cardiac Enzymes: No results for input(s): "CKTOTAL", "CKMB", "CKMBINDEX", "TROPONINI" in the last 168 hours. BNP (last 3 results) No results for input(s): "PROBNP" in the last 8760 hours. HbA1C: No results for input(s): "HGBA1C" in the last 72 hours. CBG: Recent Labs  Lab 03/07/2023 1744  GLUCAP 152*   Lipid Profile: No results for input(s): "CHOL", "HDL", "LDLCALC", "TRIG", "CHOLHDL", "LDLDIRECT" in the last 72 hours. Thyroid Function Tests: No results for input(s): "TSH", "T4TOTAL", "FREET4", "T3FREE", "THYROIDAB" in the last 72 hours. Anemia Panel: No results for input(s): "VITAMINB12", "FOLATE", "FERRITIN", "TIBC", "IRON", "RETICCTPCT" in the last 72 hours. Sepsis Labs: No results for input(s): "PROCALCITON", "LATICACIDVEN" in the last 168 hours.  No results found for this or any previous visit (from the past 240 hour(s)).    Radiology Studies: CT HEAD CODE STROKE WO CONTRAST  Addendum Date: 02/28/2023   ADDENDUM REPORT: 03/06/2023 18:21 ADDENDUM: Impressions #1 and #2 were called by telephone at the time of interpretation  on 03/13/2023 at 6:06 pm to provider Dr. Amada Jupiter, who verbally acknowledged these results. Electronically Signed   By: Jackey Loge D.O.   On: 03/19/2023 18:21   Result Date: 02/24/2023 CLINICAL DATA:  Code stroke. Neuro deficit, acute, stroke suspected. Aphasia, right-sided weakness, left gaze, left facial droop. EXAM: CT HEAD WITHOUT CONTRAST TECHNIQUE: Contiguous axial images were obtained from the base of the skull through the vertex without intravenous contrast. RADIATION DOSE REDUCTION: This exam was performed according to the departmental dose-optimization program which includes automated exposure control, adjustment of the mA and/or kV according to patient size and/or use of iterative reconstruction technique. COMPARISON:  Concurrently performed CTA head/neck 03/18/2023. FINDINGS: Brain: Generalized cerebral atrophy. Patchy and ill-defined hypoattenuation within the cerebral white matter, nonspecific but compatible with mild chronic small vessel ischemic disease. There is no acute intracranial hemorrhage. No demarcated cortical infarct. No extra-axial fluid collection. No evidence of an intracranial mass. No midline shift. Vascular: Hyperdensity of the M1 left middle cerebral artery consistent with the presence of thrombus (as demonstrated on the concurrently performed  CTA head/neck). Atherosclerotic calcifications. Skull: No fracture or aggressive osseous lesion. Sinuses/Orbits: No mass or acute finding within the imaged orbits. No orbital mass or acute orbital finding. No significant paranasal sinus disease. ASPECTS [facility] Stroke Program Early CT Score) - Ganglionic level infarction (caudate, lentiform nuclei, internal capsule, insula, M1-M3 cortex): 7 - Supraganglionic infarction (M4-M6 cortex): 3 Total score (0-10 with 10 being normal): 10 Attempts are being major to ordering provider at this time. IMPRESSION: 1. No evidence of acute intracranial hemorrhage or an acute demarcated cortical infarct.  ASPECTS is 10. 2. Asymmetric hyperdensity of the M1 left middle cerebral artery consistent with the presence of thrombus. 3. Parenchymal atrophy and chronic small vessel disease. Electronically Signed: By: Jackey Loge D.O. On: 03/16/2023 18:06   CT ANGIO HEAD NECK W WO CM (CODE STROKE)  Result Date: 03/12/2023 CLINICAL DATA:  Provided history: Neuro deficit, acute, stroke suspected. Aphasia, right-sided weakness, left gaze, left facial droop. EXAM: CT ANGIOGRAPHY HEAD AND NECK WITH AND WITHOUT CONTRAST TECHNIQUE: Multidetector CT imaging of the head and neck was performed using the standard protocol during bolus administration of intravenous contrast. Multiplanar CT image reconstructions and MIPs were obtained to evaluate the vascular anatomy. Carotid stenosis measurements (when applicable) are obtained utilizing NASCET criteria, using the distal internal carotid diameter as the denominator. RADIATION DOSE REDUCTION: This exam was performed according to the departmental dose-optimization program which includes automated exposure control, adjustment of the mA and/or kV according to patient size and/or use of iterative reconstruction technique. CONTRAST:  75mL OMNIPAQUE IOHEXOL 350 MG/ML SOLN COMPARISON:  Noncontrast head CT performed earlier today 02/26/2023. FINDINGS: CTA NECK FINDINGS Aortic arch: The left vertebral artery arises directly from the aortic arch. Atherosclerotic plaque within the visualized aortic arch and proximal major branch vessels of the neck. No hemodynamically significant innominate or proximal subclavian artery stenosis. Right carotid system: CCA and ICA patent within the neck without stenosis or significant atherosclerotic disease. Tortuosity of the cervical ICA. Left carotid system: CCA and ICA patent within the neck without stenosis. Minimal atherosclerotic plaque within the CCA and about the carotid bifurcation. Tortuosity of the distal cervical ICA. Vertebral arteries: Vertebral  arteries patent within the neck without stenosis. Nonstenotic atherosclerotic plaque at the left vertebral artery origin. Skeleton: Advanced cervical spondylosis. Posterior disc osteophyte complexes contribute to spinal canal stenosis which is at least moderate (and may be severe) at C3-C4, C4-C5, C5-C6 and C6-C7. No acute fracture or aggressive osseous lesion. Other neck: No neck mass or cervical lymphadenopathy. Upper chest: Partially imaged pleural effusions (larger on the right). Review of the MIP images confirms the above findings CTA HEAD FINDINGS Anterior circulation: The intracranial internal carotid arteries are patent. Atherosclerotic plaque within both vessels with no more than mild stenosis. Abrupt occlusion of the left MCA proximal M1 segment (series 10, image 18) (series 11, image 25). There is some enhancement of M2 and more distal left MCA vessels due to collateral flow, however, this enhancement is significantly diminished as compared to that on the right. The M1 right middle cerebral artery is patent. No right M2 proximal branch occlusion or high-grade proximal stenosis. The anterior cerebral arteries are patent. Hypoplastic right A1 segment. Posterior circulation: The intracranial vertebral arteries are patent. Calcified plaque within the left V4 segment (distal to the PICA origin) resulting in mild-to-moderate stenosis. The basilar artery is patent. The posterior cerebral arteries are patent. The right PCA is predominantly (or entirely) fetal in origin. A left posterior communicating artery is present. Venous sinuses: Evaluation  for dural venous sinus thrombosis is limited due to contrast timing. Anatomic variants: As described. Review of the MIP images confirms the above findings CTA head impression #1 called by telephone at the time of interpretation on 03/06/2023 at 6:06 pm to provider [facility] , who verbally acknowledged these results. IMPRESSION: CTA neck: 1. The common carotid,  internal carotid and vertebral arteries are patent within the neck without stenosis. Minimal atherosclerotic plaque within the left common carotid artery and about the left carotid bifurcation. Non-stenotic atherosclerotic plaque at the origin of the left vertebral artery. 2.  Aortic Atherosclerosis (ICD10-I70.0). 3. Partially imaged bilateral pleural effusions (right greater than left). CTA head: 1. Abrupt occlusion of the left middle cerebral artery proximal M1 segment. 2. Background intracranial atherosclerotic disease as described. Electronically Signed   By: Jackey Loge D.O.   On: 03/14/2023 18:21    Scheduled Meds:  atropine  2 drop Sublingual QID   glycopyrrolate  0.4 mg Intravenous Q4H   Continuous Infusions:  chlorproMAZINE (THORAZINE) 12.5 mg in sodium chloride 0.9 % 25 mL IVPB     HYDROmorphone 4 mg/hr (03/04/2023 0951)     LOS: 1 day   Time spent: 30 minutes   Francisco Olsen Estill Cotta, MD [facility]  If 7PM-7AM, please contact night-coverage [email or web address] 03/10/2023, 10:17 AM

## 2023-03-21 NOTE — Consult Note (Signed)
Palliative Medicine Inpatient Consult Note  Consulting Provider: Allena Katz  Reason for consult:   Palliative Care Consult Services Palliative Medicine Consult  Reason for Consult? Acute stroke with metastatic pancreatic cancer admitted for comfort care measures.  Family seeking hospice facility, spouse's preference is for [facility].   03/16/2023  HPI:  Per intake H&P --> Francisco Olsen is a 82 y.o. male with medical history significant for metastatic pancreatic cancer with liver metastases, chronic atrial fibrillation on Coumadin, diabetes, HTN who presented from home and found to have a massive stroke.   Palliative care has been asked to get involved to support end of life management.  Patient is enrolled in [facility] OP services.  Clinical Assessment/Goals of Care:  *Please note that this is a verbal dictation therefore any spelling or grammatical errors are due to the "Dragon Medical One" system interpretation.  I have reviewed medical records including EPIC notes, labs and imaging, received report from bedside RN, assessed the patient who is breathing rapidly and has tremendous secretion burden.    I met with patients wife, two sons, and daughter to further discuss diagnosis prognosis, GOC, EOL wishes, disposition and options.   I introduced Palliative Medicine as specialized medical care for people living with serious illness. It focuses on providing relief from the symptoms and stress of a serious illness. The goal is to improve quality of life for both the patient and the family.  Medical History Review and Understanding:  A review of Francisco Olsen's past medical history inclusive of his metastatic pancreatic cancer which is a new diagnosis was held.  Additionally looked over history of diabetes, hypertension, atrial fibrillation.  Social History:  Francisco Olsen lives in [place].  He has been married to his wife for 62 years.  They share 3 children.  He formally  worked as a Curator.  He is a man of faith and practices within the [employer] denomination.  Functional and Nutritional State:  Up until the last few days Francisco Olsen was fully functional of all B ADLs and IADLs.  He had had some weight loss that was still eating as able.  Advance Directives:  A detailed discussion was had today regarding advanced directives.  Patient's spouse, [name] is a Social research officer, government.  Code Status:  Concepts specific to code status, artifical feeding and hydration, continued IV antibiotics and rehospitalization was had.  The difference between a aggressive medical intervention path  and a palliative comfort care path for this patient at this time was had.   Francisco Olsen is an established DNAR/DNI CODE STATUS.  Discussion:  Per discussion with family they do not want Francisco Olsen to suffer and although it is shocking they have come to terms with the reality that he is at the end of his life.  We reviewed what comfort care in the hospital looks like. We talked about transition to comfort measures in house and what that would entail inclusive of medications to control pain, dyspnea, agitation, nausea, itching, and hiccups.    We discussed stopping all uneccessary measures such as cardiac monitoring, blood draws, needle sticks, and frequent vital signs. Utilized reflective listening throughout our time together.   We discussed that Francisco Olsen has had very difficult to control secretions and I shared with them I would order medications around-the-clock to get a better hold of these.  A heated and repositioning him with the nursing staff.  I provided oral suctioning frequently will at bedside.  I provided 2 boluses and increase the Dilaudid infusion parameters.  Discussed the importance of continued conversation with family and their  medical providers regarding overall plan of care and treatment options, ensuring decisions are within the context of the patients values and GOCs.  Decision  Maker: Francisco Olsen, Francisco Olsen (Spouse): [phone] (Mobile)   SUMMARY OF RECOMMENDATIONS   DNAR/DNI  Comfort care  Dilaudid drip with boluses and titration parameters  Ordered atropine and glycopyrrolate around-the-clock  Unrestricted visitation  Ongoing palliative supportive care  Anticipate in-hospital death within hours to days  At this time patient is not appropriate for transfer to inpatient hospice given his active symptom burden  Code Status/Advance Care Planning: DNAR/DNI    Palliative Prophylaxis:  Aspiration, Bowel Regimen, Delirium Protocol, Frequent Pain Assessment, Oral Care, Palliative Wound Care, and Turn Reposition  Additional Recommendations (Limitations, Scope, Preferences): Comfort care  Psycho-social/Spiritual:  Desire for further Chaplaincy support: Yes  Additional Recommendations: Education on end of life care   Prognosis: Hours to days.   Discharge Planning: Discharge will be celestial  Vitals:   03/05/2023 1900 02/19/2023 2107  BP: (!) 150/77 134/84  Pulse: 100 86  Resp: (!) 21 17  Temp:  98.2 F (36.8 C)  SpO2: 91% 92%    Intake/Output Summary (Last 24 hours) at 03/15/2023 0755 Last data filed at 02/23/2023 2107 Gross per 24 hour  Intake 0 ml  Output --  Net 0 ml   Last Weight  Most recent update: 03/18/2023  5:48 PM    Weight  79.2 kg (174 lb 9.7 oz)            Gen: Elderly M in acute distress HEENT: moist mucous membranes CV: Regular rate and irregular rhythm  PULM: Active audible secretions, On 2LPM [place], breathing is labored ABD: soft/nontender  EXT: No edema  Neuro: Somnolent  PPS: 10%   This conversation/these recommendations were discussed with patient primary care team, Dr. Jacqulyn Bath  Billing based on MDM: High  Problems Addressed: One acute or chronic illness or injury that poses a threat to life or bodily function  Amount and/or Complexity of Data: Category 3:Discussion of management or test interpretation with external  physician/other qualified health care professional/appropriate source (not separately reported)  Risks: Decision not to resuscitate or to de-escalate care because of poor prognosis ______________________________________________________ Lamarr Lulas [place] Palliative Medicine Team Team Cell Phone: [phone] Please utilize secure chat with additional questions, if there is no response within 30 minutes please call the above phone number  Palliative Medicine Team providers are available by phone from 7am to 7pm daily and can be reached through the team cell phone.  Should this patient require assistance outside of these hours, please call the patient's attending physician.

## 2023-03-21 NOTE — Progress Notes (Signed)
[facility] 2W21 [facility]) Hospitalized Hospice patient visit   [name] is a current [facility] hospice patient with a terminal diagnosis of malignant neoplasm of pancreas and secondary malignant neoplasm of liver and intrahepatic bile duct. Patient was transported to the hospital on 5.9 via EMS after he was found to have right sided weakness and aphasia and right sided gaze.  Patient's code status with ACC is DNR.  Met with bedside RN who states patient having increased symptom burden of secretion management but PMT increasing medications.  Family at bedside and obviously distressed at turn of events.  They state secretions are better than this morning since change in medications.  Patient appears to be resting comfortably. Per PMT, patient is not stable for transition to IPU at this time.    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  Palliative care on board. -Will hold off transfer to inpatient hospice at this time.  Will continue care in the hospital. Very poor prognosis, family is aware.     DVT prophylaxis: None Code Status: DNR/DNI-on comfort measure Family Communication: Family present at bedside.  Plan of care discussed with patient family and they verbalized understanding      Discharge Planning- Per PMT, likely hospital death  Family Contact- Family at bedside.  Offered emotional support. Left liaison  contact information should needs arise.    IDT: updated ACC team Goals of Care: Full comfort care with anticipated hospital death.  Thank you for the opportunity to participate in this patient's care, please don't hesitate to call for any hospice related questions or concerns.   Doreatha Martin, RN, BSN [place] Liaison [phone]

## 2023-03-21 NOTE — Progress Notes (Signed)
Patient having very labored breathing with gurgling respirations, unable to clear secretions on his own. Attempted suctioning with little success. Paged MD to notify and gave patient Robinul & Dilaudid. Patient currently comfort care, called patient's wife to notify of patient's deteriorating condition. Patient placed on Dilaudid infusion. Family currently at bedside now, do not wish to pursue any further interventions aside from intermittent suctioning at this time.

## 2023-03-21 DEATH — deceased

## 2023-10-26 ENCOUNTER — Telehealth: Payer: Self-pay | Admitting: Genetic Counselor

## 2023-10-26 NOTE — Telephone Encounter (Signed)
 I contacted [name] to discuss his genetic testing results. No pathogenic variants were identified in the 71 genes analyzed. We shared the results with her email address. A portion of the result report is included below for reference.   Nana Vastine, MS, [facility] Genetic Counselor [email or web address] (P) [phone]
